# Patient Record
Sex: Male | Born: 1955 | Race: White | Hispanic: No | Marital: Married | Smoking: Never smoker
Health system: Southern US, Community
[De-identification: ages and names within clinical notes are randomized; demographics above are authoritative.]

## PROBLEM LIST (undated history)

## (undated) DIAGNOSIS — R55 Syncope and collapse: Secondary | ICD-10-CM

## (undated) DIAGNOSIS — T7840XA Allergy, unspecified, initial encounter: Secondary | ICD-10-CM

## (undated) DIAGNOSIS — K649 Unspecified hemorrhoids: Secondary | ICD-10-CM

## (undated) DIAGNOSIS — K219 Gastro-esophageal reflux disease without esophagitis: Secondary | ICD-10-CM

## (undated) DIAGNOSIS — J302 Other seasonal allergic rhinitis: Secondary | ICD-10-CM

## (undated) DIAGNOSIS — I10 Essential (primary) hypertension: Secondary | ICD-10-CM

## (undated) DIAGNOSIS — R519 Headache, unspecified: Secondary | ICD-10-CM

## (undated) DIAGNOSIS — K602 Anal fissure, unspecified: Secondary | ICD-10-CM

## (undated) DIAGNOSIS — R51 Headache: Secondary | ICD-10-CM

## (undated) DIAGNOSIS — K635 Polyp of colon: Secondary | ICD-10-CM

## (undated) DIAGNOSIS — G459 Transient cerebral ischemic attack, unspecified: Secondary | ICD-10-CM

## (undated) DIAGNOSIS — E785 Hyperlipidemia, unspecified: Secondary | ICD-10-CM

## (undated) HISTORY — DX: Headache: R51

## (undated) HISTORY — PX: COLONOSCOPY W/ POLYPECTOMY: SHX1380

## (undated) HISTORY — PX: RHINOPLASTY: SUR1284

## (undated) HISTORY — DX: Essential (primary) hypertension: I10

## (undated) HISTORY — DX: Unspecified hemorrhoids: K64.9

## (undated) HISTORY — DX: Anal fissure, unspecified: K60.2

## (undated) HISTORY — DX: Gastro-esophageal reflux disease without esophagitis: K21.9

## (undated) HISTORY — DX: Hyperlipidemia, unspecified: E78.5

## (undated) HISTORY — DX: Allergy, unspecified, initial encounter: T78.40XA

## (undated) HISTORY — DX: Headache, unspecified: R51.9

## (undated) HISTORY — DX: Syncope and collapse: R55

## (undated) HISTORY — DX: Other seasonal allergic rhinitis: J30.2

## (undated) HISTORY — DX: Polyp of colon: K63.5

## (undated) HISTORY — DX: Transient cerebral ischemic attack, unspecified: G45.9

---

## 1999-04-16 ENCOUNTER — Emergency Department (HOSPITAL_COMMUNITY): Admission: EM | Admit: 1999-04-16 | Discharge: 1999-04-16 | Payer: Self-pay | Admitting: Emergency Medicine

## 1999-04-21 ENCOUNTER — Encounter: Payer: Self-pay | Admitting: Family Medicine

## 1999-04-21 ENCOUNTER — Ambulatory Visit (HOSPITAL_COMMUNITY): Admission: RE | Admit: 1999-04-21 | Discharge: 1999-04-21 | Payer: Self-pay | Admitting: Family Medicine

## 2000-10-08 ENCOUNTER — Encounter (INDEPENDENT_AMBULATORY_CARE_PROVIDER_SITE_OTHER): Payer: Self-pay | Admitting: Specialist

## 2000-10-08 ENCOUNTER — Other Ambulatory Visit: Admission: RE | Admit: 2000-10-08 | Discharge: 2000-10-08 | Payer: Self-pay | Admitting: Internal Medicine

## 2003-06-04 ENCOUNTER — Encounter: Admission: RE | Admit: 2003-06-04 | Discharge: 2003-06-04 | Payer: Self-pay | Admitting: Family Medicine

## 2003-06-04 ENCOUNTER — Encounter: Payer: Self-pay | Admitting: Family Medicine

## 2005-06-13 ENCOUNTER — Ambulatory Visit: Payer: Self-pay | Admitting: Internal Medicine

## 2005-11-14 ENCOUNTER — Observation Stay (HOSPITAL_COMMUNITY): Admission: EM | Admit: 2005-11-14 | Discharge: 2005-11-15 | Payer: Self-pay | Admitting: Emergency Medicine

## 2008-03-04 ENCOUNTER — Encounter: Admission: RE | Admit: 2008-03-04 | Discharge: 2008-03-04 | Payer: Self-pay | Admitting: Family Medicine

## 2008-08-24 ENCOUNTER — Ambulatory Visit: Payer: Self-pay | Admitting: Internal Medicine

## 2008-09-07 ENCOUNTER — Ambulatory Visit: Payer: Self-pay | Admitting: Internal Medicine

## 2008-09-07 ENCOUNTER — Encounter: Payer: Self-pay | Admitting: Internal Medicine

## 2008-09-09 ENCOUNTER — Encounter: Payer: Self-pay | Admitting: Internal Medicine

## 2008-11-17 ENCOUNTER — Telehealth: Payer: Self-pay | Admitting: Internal Medicine

## 2008-11-23 ENCOUNTER — Ambulatory Visit: Payer: Self-pay | Admitting: Gastroenterology

## 2008-11-23 DIAGNOSIS — K648 Other hemorrhoids: Secondary | ICD-10-CM | POA: Insufficient documentation

## 2008-11-23 DIAGNOSIS — Z860100 Personal history of colon polyps, unspecified: Secondary | ICD-10-CM | POA: Insufficient documentation

## 2008-11-23 DIAGNOSIS — K625 Hemorrhage of anus and rectum: Secondary | ICD-10-CM | POA: Insufficient documentation

## 2008-11-30 ENCOUNTER — Telehealth (INDEPENDENT_AMBULATORY_CARE_PROVIDER_SITE_OTHER): Payer: Self-pay | Admitting: *Deleted

## 2008-12-09 ENCOUNTER — Encounter: Payer: Self-pay | Admitting: Internal Medicine
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
## 2011-09-19 ENCOUNTER — Other Ambulatory Visit: Payer: Self-pay | Admitting: Internal Medicine

## 2011-09-19 DIAGNOSIS — M542 Cervicalgia: Secondary | ICD-10-CM

## 2011-09-28 ENCOUNTER — Ambulatory Visit
Admission: RE | Admit: 2011-09-28 | Discharge: 2011-09-28 | Disposition: A | Discharge status: Home / Self Care | Payer: BC Managed Care – PPO | Source: Ambulatory Visit | Attending: Internal Medicine | Admitting: Internal Medicine

## 2011-09-28 DIAGNOSIS — M542 Cervicalgia: Secondary | ICD-10-CM

## 2011-12-19 ENCOUNTER — Encounter: Payer: Self-pay | Admitting: *Deleted

## 2012-03-06 ENCOUNTER — Encounter: Payer: Self-pay | Admitting: *Deleted

## 2012-03-07 ENCOUNTER — Encounter: Payer: Self-pay | Admitting: Cardiology

## 2012-08-15 ENCOUNTER — Ambulatory Visit (INDEPENDENT_AMBULATORY_CARE_PROVIDER_SITE_OTHER): Payer: BC Managed Care – PPO | Admitting: Family Medicine

## 2012-08-15 DIAGNOSIS — Z23 Encounter for immunization: Secondary | ICD-10-CM

## 2013-02-04 ENCOUNTER — Encounter: Payer: Self-pay | Admitting: Cardiology

## 2013-03-05 ENCOUNTER — Encounter: Payer: Self-pay | Admitting: Cardiovascular Disease

## 2013-07-17 ENCOUNTER — Encounter: Payer: Self-pay | Admitting: Internal Medicine

## 2013-07-31 ENCOUNTER — Encounter: Payer: Self-pay | Admitting: Internal Medicine

## 2013-08-19 ENCOUNTER — Encounter: Payer: BC Managed Care – PPO | Admitting: Cardiovascular Disease

## 2013-09-04 ENCOUNTER — Ambulatory Visit (INDEPENDENT_AMBULATORY_CARE_PROVIDER_SITE_OTHER): Payer: BC Managed Care – PPO | Admitting: Family Medicine

## 2013-09-04 DIAGNOSIS — Z23 Encounter for immunization: Secondary | ICD-10-CM

## 2013-09-18 ENCOUNTER — Ambulatory Visit (AMBULATORY_SURGERY_CENTER): Payer: Self-pay

## 2013-09-18 VITALS — Ht 74.0 in | Wt 185.0 lb

## 2013-09-18 DIAGNOSIS — Z860100 Personal history of colon polyps, unspecified: Secondary | ICD-10-CM

## 2013-09-18 MED ORDER — MOVIPREP 100 G PO SOLR: 1.0000 | Freq: Once | ORAL | Status: DC

## 2013-09-19 ENCOUNTER — Encounter: Payer: Self-pay | Admitting: Internal Medicine

## 2013-09-23 ENCOUNTER — Encounter: Payer: Self-pay | Admitting: Cardiovascular Disease

## 2013-09-23 ENCOUNTER — Ambulatory Visit (INDEPENDENT_AMBULATORY_CARE_PROVIDER_SITE_OTHER): Payer: BC Managed Care – PPO | Admitting: Cardiovascular Disease

## 2013-09-23 VITALS — BP 130/88 | HR 67 | Ht 74.0 in | Wt 196.4 lb

## 2013-09-23 DIAGNOSIS — I951 Orthostatic hypotension: Secondary | ICD-10-CM | POA: Insufficient documentation

## 2013-09-23 DIAGNOSIS — R55 Syncope and collapse: Secondary | ICD-10-CM

## 2013-09-23 MED ORDER — ASPIRIN 81 MG PO TABS: 81.0000 mg | ORAL_TABLET | Freq: Every day | ORAL | Status: DC

## 2013-09-23 NOTE — Progress Notes (Signed)
     Daniel Wu Date of Birth  05/16/1956       [facility] Office [address]  [address], [address] [place], [place]  [address]   [place], [place]  [address] [phone]     [phone]   Fax  [phone]    Fax [phone]  Problem List: 1. Presyncope 2. Chest pain 3. hyperlipidemia  History of Present Illness:  Daniel Wu who has had presyncope in the past.  He still has some mild episodes - lasts 3-5 seconds at most.  Occurs when he stands up from a seated position or if he has been standing for a while. There is no vertigo symptoms.  He goes to the [facility] on a regular basis.  He has no issues when he's working out.  He works in Airline pilot in [employer].   Works part time now.    Current Outpatient Prescriptions on File Prior to Visit  Medication Sig Dispense Refill  . aspirin 325 MG tablet Take 325 mg by mouth daily.       No current facility-administered medications on file prior to visit.    Allergies  Allergen Reactions  . Penicillins Cross Reactors Swelling    All "cillins"    Past Medical History  Diagnosis Date  . Syncope   . HLD (hyperlipidemia)   . GERD (gastroesophageal reflux disease)     Past Surgical History  Procedure Laterality Date  . Colonoscopy w/ polypectomy    . Rhinoplasty      History  Smoking status  . Never Smoker   Smokeless tobacco  . Never Used    History  Alcohol Use  . 1.0 oz/week  . 2 drink(s) per week    Family History  Problem Relation Age of Onset  . Breast cancer    . Lung cancer    . Hypertension    . Diabetes    . Stomach cancer Father   . Colon cancer Neg Hx   . Pancreatic cancer Neg Hx     Reviw of Systems:  Reviewed in the HPI.  All other systems are negative.  Physical Exam: Blood pressure 130/88, pulse 67, height 6\' 2"  (1.88 m), weight 196 lb 6.4 oz (89.086 kg). General: Well developed, well nourished, in no acute  distress.  Head: Normocephalic, atraumatic, sclera non-icteric, mucus membranes are moist,   Neck: Supple. Carotids are 2 + without bruits. No JVD   Lungs: Clear   Heart: RR, normal S1 S2  Abdomen: Soft, non-tender, non-distended with normal bowel sounds.  Msk:  Strength and tone are normal   Extremities: No clubbing or cyanosis. No edema.  Distal pedal pulses are 2+ and equal    Neuro: CN II - XII intact.  Alert and oriented X 3.   Psych:  Normal  ECG: Dec. 23, 2014:  NR at 52.  Normal ECG  Assessment / Plan:

## 2013-09-23 NOTE — Assessment & Plan Note (Signed)
Daniel Wu is doing well.  He has continued to have mild episodes of orthostatic hypotension.  We have discussed keeping hydrated and eating regularly.    He should continue exercising.    I will see him on an as needed basis.

## 2013-09-23 NOTE — Patient Instructions (Signed)
Your physician has recommended you make the following change in your medication: REDUCE ASPIRIN TO 81 MG DAILY  Your physician recommends that you schedule a follow-up appointment in: AS NEEDED

## 2013-10-09 ENCOUNTER — Encounter: Payer: Self-pay | Admitting: Internal Medicine

## 2013-10-09 ENCOUNTER — Ambulatory Visit (AMBULATORY_SURGERY_CENTER): Payer: BC Managed Care – PPO | Admitting: Internal Medicine

## 2013-10-09 VITALS — BP 126/79 | HR 69 | Temp 96.3°F | Resp 20 | Ht 74.0 in | Wt 185.0 lb

## 2013-10-09 DIAGNOSIS — Z86010 Personal history of colonic polyps: Secondary | ICD-10-CM

## 2013-10-09 MED ORDER — SODIUM CHLORIDE 0.9 % IV SOLN: 500.0000 mL | INTRAVENOUS | Status: DC

## 2013-10-09 NOTE — Op Note (Signed)
[place]  [address] [place], [address]   COLONOSCOPY PROCEDURE REPORT  PATIENT: [name], Daniel  MR#: [id number] BIRTHDATE: [birth year] , 57  yrs. old GENDER: Male ENDOSCOPIST: Eustace Quail, MD REFERRED WU:[id number] Program Recall PROCEDURE DATE:  10/09/2013 PROCEDURE:   Colonoscopy, surveillance First Screening Colonoscopy - Avg.  risk and is 50 yrs.  old or older - No.  Prior Negative Screening - Now for repeat screening. N/A  History of Adenoma - Now for follow-up colonoscopy & has been > or = to 3 yrs.  Yes hx of adenoma.  Has been 3 or more years since last colonoscopy.  Polyps Removed Today? No.  Recommend repeat exam, <10 yrs? Yes.  High risk (family or personal hx). ASA CLASS:   Class II INDICATIONS:Patient's personal history of MULTIPLE adenomatous colon polyps.   Prior exams 1996,97,2002,05,2010 MEDICATIONS: MAC sedation, administered by CRNA and propofol (Diprivan) 300mg  IV  DESCRIPTION OF PROCEDURE:   After the risks benefits and alternatives of the procedure were thoroughly explained, informed consent was obtained.  A digital rectal exam revealed no abnormalities of the rectum.   The LB [id number]  endoscope was introduced through the anus and advanced to the cecum, which was identified by both the appendix and ileocecal valve. No adverse events experienced.   The quality of the prep was excellent, using MoviPrep  The instrument was then slowly withdrawn as the colon was fully examined.   COLON FINDINGS: A normal appearing cecum, ileocecal valve, and appendiceal orifice were identified.  The ascending, hepatic flexure, transverse, splenic flexure, descending, sigmoid colon and rectum appeared unremarkable.  No polyps or cancers were seen. Retroflexed views revealed internal hemorrhoids. The time to cecum=2 minutes 50 seconds.  Withdrawal time=10 minutes 04 seconds. The scope was withdrawn and the procedure  completed. COMPLICATIONS: There were no complications.  ENDOSCOPIC IMPRESSION: 1. Normal colon  RECOMMENDATIONS: 1. Follow up colonoscopy in 5 years (personal hx multiple polyps)   eSigned:  Eustace Quail, MD 10/09/2013 9:31 AM   cc: Leanna Battles, MD and The Patient

## 2013-10-09 NOTE — Progress Notes (Signed)
Lidocaine-40mg IV prior to Propofol InductionPropofol given over incremental dosages 

## 2013-10-09 NOTE — Patient Instructions (Addendum)
YOU HAD AN ENDOSCOPIC PROCEDURE TODAY AT THE [facility]: Refer to the procedure report that was given to you for any specific questions about what was found during the examination.  If the procedure report does not answer your questions, please call your gastroenterologist to clarify.  If you requested that your care partner not be given the details of your procedure findings, then the procedure report has been included in a sealed envelope for you to review at your convenience later.  YOU SHOULD EXPECT: Some feelings of bloating in the abdomen. Passage of more gas than usual.  Walking can help get rid of the air that was put into your GI tract during the procedure and reduce the bloating. If you had a lower endoscopy (such as a colonoscopy or flexible sigmoidoscopy) you may notice spotting of blood in your stool or on the toilet paper. If you underwent a bowel prep for your procedure, then you may not have a normal bowel movement for a few days.  DIET: Your first meal following the procedure should be a light meal and then it is ok to progress to your normal diet.  A half-sandwich or bowl of soup is an example of a good first meal.  Heavy or fried foods are harder to digest and may make you feel nauseous or bloated.  Likewise meals heavy in dairy and vegetables can cause extra gas to form and this can also increase the bloating.  Drink plenty of fluids but you should avoid alcoholic beverages for 24 hours.  ACTIVITY: Your care partner should take you home directly after the procedure.  You should plan to take it easy, moving slowly for the rest of the day.  You can resume normal activity the day after the procedure however you should NOT DRIVE or use heavy machinery for 24 hours (because of the sedation medicines used during the test).    SYMPTOMS TO REPORT IMMEDIATELY: A gastroenterologist can be reached at any hour.  During normal business hours, 8:30 AM to 5:00 PM Monday through Friday,  call [phone].  After hours and on weekends, please call the GI answering service at [phone] who will take a message and have the physician on call contact you.   Following lower endoscopy (colonoscopy or flexible sigmoidoscopy):  Excessive amounts of blood in the stool  Significant tenderness or worsening of abdominal pains  Swelling of the abdomen that is new, acute  Fever of 100F or higher    FOLLOW UP: If any biopsies were taken you will be contacted by phone or by letter within the next 1-3 weeks.  Call your gastroenterologist if you have not heard about the biopsies in 3 weeks.  Our staff will call the home number listed on your records the next business day following your procedure to check on you and address any questions or concerns that you may have at that time regarding the information given to you following your procedure. This is a courtesy call and so if there is no answer at the home number and we have not heard from you through the emergency physician on call, we will assume that you have returned to your regular daily activities without incident.  SIGNATURES/CONFIDENTIALITY: You and/or your care partner have signed paperwork which will be entered into your electronic medical record.  These signatures attest to the fact that that the information above on your After Visit Summary has been reviewed and is understood.  Full responsibility of the confidentiality   of this discharge information lies with you and/or your care-partner.  Normal colonoscopy.    Repeat in 5 years-2020 (hx multiple polyps)

## 2013-10-10 ENCOUNTER — Telehealth: Payer: Self-pay

## 2013-10-10 NOTE — Telephone Encounter (Signed)
Left message on answering machine. 

## 2014-10-02 DIAGNOSIS — G459 Transient cerebral ischemic attack, unspecified: Secondary | ICD-10-CM

## 2014-10-02 HISTORY — DX: Transient cerebral ischemic attack, unspecified: G45.9

## 2014-10-12 ENCOUNTER — Ambulatory Visit (INDEPENDENT_AMBULATORY_CARE_PROVIDER_SITE_OTHER): Payer: 59 | Admitting: Physician Assistant

## 2014-10-12 ENCOUNTER — Encounter: Payer: Self-pay | Admitting: Physician Assistant

## 2014-10-12 VITALS — BP 134/88 | HR 64 | Ht 74.0 in | Wt 197.0 lb

## 2014-10-12 DIAGNOSIS — I517 Cardiomegaly: Secondary | ICD-10-CM

## 2014-10-12 DIAGNOSIS — R079 Chest pain, unspecified: Secondary | ICD-10-CM

## 2014-10-12 DIAGNOSIS — I341 Nonrheumatic mitral (valve) prolapse: Secondary | ICD-10-CM

## 2014-10-12 DIAGNOSIS — Z0000 Encounter for general adult medical examination without abnormal findings: Secondary | ICD-10-CM

## 2014-10-12 DIAGNOSIS — R42 Dizziness and giddiness: Secondary | ICD-10-CM

## 2014-10-12 NOTE — Assessment & Plan Note (Addendum)
Patient had an episode of chest pain that lasted a couple days and was constant. He had no associated symptoms other than bloating sensation. He has no cardiac risk factors for CAD. EKG is normal. He had normal stress Myoview in 2011. He did have septal hypertrophy and mitral valve prolapse on 2-D echo in 2011. We will repeat that. He will follow-up with Dr.Nahser in one month. He had full labs last May by Dr. Sharlett Iles, I won't repeat.

## 2014-10-12 NOTE — Assessment & Plan Note (Signed)
Patient has had chronic problems with dizziness sometimes orthostatic and other times vertigo. Most recently sounds like vertigo relieved with meclizine.

## 2014-10-12 NOTE — Progress Notes (Signed)
HPI: This is a 59 year old male patient of Dr.Nahser that has history of presyncope felt secondary to orthostatic hypotension. He also has a history of chest pain and hyperlipidemia. He was last seen here in December 2014. Patient had a normal stress Myoview in 2011. 2-D echo in 2011 showed mild LVH with impaired LV relaxation and normal LV systolic function, mitral valve prolapse with posterior valve prolapse, mild MR, mild AI and prominent upper septal hypertrophy.  Patient comes in today because 2 weeks ago he developed a bloated feeling followed by a sensation of something sitting on his chest. It lasted a couple days and was constant. Exertion didn't make it any worse. He described it as a 2 on a scale of 1-10. He had no radiation of the pain associated dyspnea, dyspnea on exertion, palpitations, or presyncope. This was followed by a cramp in his right chest that lasted 3 days. He says this was worse when he laid down at night. The patient walks 3-4 miles a day on a treadmill, elliptical, or bike at the [facility]. He has no symptoms while exercising. He does have vertigo for which he takes meclizine. He feels like his balance has been off little bit at times and he did have dizziness yesterday while standing relieved with meclizine.  Patient has no history of hypertension, diabetes mellitus, hyperlipidemia, he's never smoked and has no family history of CAD. His mother and father died at 74 and 11 of other problems.   Allergies  Allergen Reactions  . Penicillins Cross Reactors Swelling    All "cillins"     Current Outpatient Prescriptions  Medication Sig Dispense Refill  . aspirin 81 MG tablet Take 1 tablet (81 mg total) by mouth daily.     No current facility-administered medications for this visit.    Past Medical History  Diagnosis Date  . Syncope   . HLD (hyperlipidemia)   . GERD (gastroesophageal reflux disease)     Past Surgical History  Procedure Laterality Date    . Colonoscopy w/ polypectomy    . Rhinoplasty      Family History  Problem Relation Age of Onset  . Breast cancer    . Lung cancer    . Hypertension    . Diabetes    . Stomach cancer Father   . Colon cancer Neg Hx   . Pancreatic cancer Neg Hx     History   Social History  . Marital Status: Married    Spouse Name: N/A    Number of Children: 2  . Years of Education: N/A   Occupational History  . sales executive    Social History Main Topics  . Smoking status: Never Smoker   . Smokeless tobacco: Never Used  . Alcohol Use: 1.0 oz/week    2 drink(s) per week  . Drug Use: Not on file  . Sexual Activity: Not on file   Other Topics Concern  . Not on file   Social History Narrative  . No narrative on file    ROS: See history of present illness otherwise negative  BP 134/88 mmHg  Pulse 64  Ht 6\' 2"  (1.88 m)  Wt 197 lb (89.359 kg)  BMI 25.28 kg/m2  PHYSICAL EXAM: Well-nournished, in no acute distress. Neck: No JVD, HJR, Bruit, or thyroid enlargement  Lungs: No tachypnea, clear without wheezing, rales, or rhonchi  Cardiovascular: RRR, PMI not displaced, Normal S1 and S2, no murmurs, gallops, bruit, thrill, or heave.  Abdomen: BS normal. Soft without organomegaly, masses, lesions or tenderness.  Extremities: without cyanosis, clubbing or edema. Good distal pulses bilateral  SKin: Warm, no lesions or rashes   Musculoskeletal: No deformities  Neuro: no focal signs   Wt Readings from Last 3 Encounters:  10/09/13 185 lb (83.915 kg)  09/23/13 196 lb 6.4 oz (89.086 kg)  09/18/13 185 lb (83.915 kg)    No results found for: WBC, HGB, HCT, PLT, GLUCOSE, CHOL, TRIG, HDL, LDLDIRECT, LDLCALC, ALT, AST, NA, K, CL, CREATININE, BUN, CO2, TSH, PSA, INR, GLUF, HGBA1C, MICROALBUR  EKG: Normal sinus rhythm normal EKG

## 2014-10-12 NOTE — Patient Instructions (Signed)
Your physician has requested that you have an echocardiogram. Echocardiography is a painless test that uses sound waves to create images of your heart. It provides your doctor with information about the size and shape of your heart and how well your heart's chambers and valves are working. This procedure takes approximately one hour. There are no restrictions for this procedure.  You have been referred to Nutritionist   Your physician recommends that you schedule a follow-up appointment within the month with Dr. Cathie Olden.

## 2014-10-20 ENCOUNTER — Ambulatory Visit (HOSPITAL_COMMUNITY): Payer: 59 | Attending: Cardiology | Admitting: Cardiology

## 2014-10-20 DIAGNOSIS — I517 Cardiomegaly: Secondary | ICD-10-CM | POA: Diagnosis not present

## 2014-10-20 DIAGNOSIS — Z0000 Encounter for general adult medical examination without abnormal findings: Secondary | ICD-10-CM | POA: Insufficient documentation

## 2014-10-20 DIAGNOSIS — I341 Nonrheumatic mitral (valve) prolapse: Secondary | ICD-10-CM | POA: Diagnosis not present

## 2014-10-20 DIAGNOSIS — R079 Chest pain, unspecified: Secondary | ICD-10-CM | POA: Insufficient documentation

## 2014-10-20 NOTE — Progress Notes (Signed)
Echo performed. 

## 2014-11-06 ENCOUNTER — Encounter: Payer: Self-pay | Admitting: Cardiovascular Disease

## 2014-11-06 ENCOUNTER — Ambulatory Visit (INDEPENDENT_AMBULATORY_CARE_PROVIDER_SITE_OTHER): Payer: 59 | Admitting: Cardiovascular Disease

## 2014-11-06 VITALS — BP 128/84 | HR 68 | Ht 74.0 in | Wt 191.8 lb

## 2014-11-06 DIAGNOSIS — R079 Chest pain, unspecified: Secondary | ICD-10-CM

## 2014-11-06 DIAGNOSIS — I951 Orthostatic hypotension: Secondary | ICD-10-CM

## 2014-11-06 DIAGNOSIS — I341 Nonrheumatic mitral (valve) prolapse: Secondary | ICD-10-CM

## 2014-11-06 NOTE — Progress Notes (Signed)
Cardiology Office Note   Date:  11/06/2014   ID:  Daniel Wu, DOB 02-03-1956, MRN [id number]  PCP:  Donnajean Lopes, MD  Cardiologist:   Thayer Headings, MD   Chief Complaint  Patient presents with  . Follow-up    chest pain     Problem List  1. Presyncope 2. Chest pain 3. Hyperlipidemia 4. Mitral Valve prolapse   History of Present Illness: Daniel Wu is a 59 y.o. male who presents for an episode of chest pain . He was seen by Estella Husk several weeks ago for CP.   Daniel Wu has been having some atypical CP / pressures.  Worse when he slept on his left side.  Better if he would lie down on his back . He exercises on a rectal basis. He's not had any episodes of exertional chest pain or discomfort.    Past Medical History  Diagnosis Date  . Syncope   . HLD (hyperlipidemia)   . GERD (gastroesophageal reflux disease)     Past Surgical History  Procedure Laterality Date  . Colonoscopy w/ polypectomy    . Rhinoplasty       Current Outpatient Prescriptions  Medication Sig Dispense Refill  . aspirin 81 MG tablet Take 1 tablet (81 mg total) by mouth daily.     No current facility-administered medications for this visit.    Allergies:   Penicillins cross reactors    Social History:  The patient  reports that he has never smoked. He has never used smokeless tobacco. He reports that he drinks about 1.0 oz of alcohol per week.   Family History:  The patient's family history includes Breast cancer in an other family member; Diabetes in an other family member; Hypertension in an other family member; Lung cancer in an other family member; Stomach cancer in his father. There is no history of Colon cancer or Pancreatic cancer.    ROS:  Please see the history of present illness.    Review of Systems: Constitutional:  denies fever, chills, diaphoresis, appetite change and fatigue.  HEENT: denies photophobia, eye pain, redness, hearing loss, ear pain, congestion,  sore throat, rhinorrhea, sneezing, neck pain, neck stiffness and tinnitus.  Respiratory: denies SOB, DOE, cough, chest tightness, and wheezing.  Cardiovascular: denies chest pain, palpitations and leg swelling.  Gastrointestinal: denies nausea, vomiting, abdominal pain, diarrhea, constipation, blood in stool.  Genitourinary: denies dysuria, urgency, frequency, hematuria, flank pain and difficulty urinating.  Musculoskeletal: denies  myalgias, back pain, joint swelling, arthralgias and gait problem.   Skin: denies pallor, rash and wound.  Neurological: denies dizziness, seizures, syncope, weakness, light-headedness, numbness and headaches.   Hematological: denies adenopathy, easy bruising, personal or family bleeding history.  Psychiatric/ Behavioral: denies suicidal ideation, mood changes, confusion, nervousness, sleep disturbance and agitation.       All other systems are reviewed and negative.    PHYSICAL EXAM: VS:  BP 128/84 mmHg  Pulse 68  Ht 6\' 2"  (1.88 m)  Wt 191 lb 12.8 oz (87 kg)  BMI 24.62 kg/m2 , BMI Body mass index is 24.62 kg/(m^2). GEN: Well nourished, well developed, in no acute distress HEENT: normal Neck: no JVD, carotid bruits, or masses Cardiac: RRR; very soft  murmur, rubs, or gallops,no edema  Respiratory:  clear to auscultation bilaterally, normal work of breathing GI: soft, nontender, nondistended, + BS MS: no deformity or atrophy Skin: warm and dry, no rash Neuro:  Strength and sensation are intact Psych: normal   EKG:  EKG  is not ordered today.    Recent Labs: No results found for requested labs within last 365 days.    Lipid Panel No results found for: CHOL, TRIG, HDL, CHOLHDL, VLDL, LDLCALC, LDLDIRECT    Wt Readings from Last 3 Encounters:  11/06/14 191 lb 12.8 oz (87 kg)  10/12/14 197 lb (89.359 kg)  10/09/13 185 lb (83.915 kg)      Other studies Reviewed: Additional studies/ records that were reviewed today include: . Review of the  above records demonstrates:    ASSESSMENT AND PLAN:  1. Orthostatic hypotension - encouraged him to stay hydrated.  2. Chest pain- Daniel Wu had some episodes of chest pain that seemed to be more musculoskeletal. He will have chest pain when he is lying on his left side and it would be relieved when he turned over in bed. He's active and works out at [employer] on a regular basis. His echocardiogram shows normal left ventricle systolic motion. He has mild mitral valve prolapse. I do not think that he needs any additional follow-up for this chest pain. 3. Hyperlipidemia - followed by his general medical doctor  4. Mitral Valve prolapse - associated with mild mitral regurgitation. I do not think that he'll ever have to have this repaired.   Current medicines are reviewed at length with the patient today.  The patient does not have concerns regarding medicines.  The following changes have been made:  no change   Disposition:   FU with me as needed .  He will follow up with Dr. Trustin Chapa Aspen     Signed, Daniel Wu, Wonda Cheng, MD  11/06/2014 9:30 AM    [address], [place], [place]  [address] Phone: [phone]; Fax: [phone]

## 2014-11-06 NOTE — Patient Instructions (Signed)
Your physician recommends that you continue on your current medications as directed. Please refer to the Current Medication list given to you today.  Your physician recommends that you schedule a follow-up appointment in: as needed with Dr. Nahser  

## 2015-03-09 ENCOUNTER — Ambulatory Visit: Payer: 59 | Admitting: Neurology

## 2015-03-11 ENCOUNTER — Ambulatory Visit (INDEPENDENT_AMBULATORY_CARE_PROVIDER_SITE_OTHER): Payer: 59 | Admitting: Neurology

## 2015-03-11 ENCOUNTER — Encounter: Payer: Self-pay | Admitting: Neurology

## 2015-03-11 VITALS — BP 146/90 | HR 69 | Ht 74.0 in | Wt 193.0 lb

## 2015-03-11 DIAGNOSIS — R410 Disorientation, unspecified: Secondary | ICD-10-CM | POA: Diagnosis not present

## 2015-03-11 DIAGNOSIS — G458 Other transient cerebral ischemic attacks and related syndromes: Secondary | ICD-10-CM
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
## 2015-03-15 ENCOUNTER — Ambulatory Visit (INDEPENDENT_AMBULATORY_CARE_PROVIDER_SITE_OTHER): Payer: 59 | Admitting: Neurology

## 2015-03-15 DIAGNOSIS — G458 Other transient cerebral ischemic attacks and related syndromes: Secondary | ICD-10-CM

## 2015-03-15 DIAGNOSIS — R410 Disorientation, unspecified: Secondary | ICD-10-CM | POA: Diagnosis not present

## 2015-03-17 ENCOUNTER — Telehealth: Payer: Self-pay

## 2015-03-17 NOTE — Telephone Encounter (Signed)
Called Patient, scheduled US Carotid.  Patient verbalized understanding. 

## 2015-03-18 ENCOUNTER — Telehealth: Payer: Self-pay | Admitting: Neurology

## 2015-03-18 NOTE — Telephone Encounter (Signed)
Patient called requesting result from EEG study. Please call and advise. Patient can be reached [phone].

## 2015-03-19 ENCOUNTER — Other Ambulatory Visit: Payer: Self-pay

## 2015-03-19 ENCOUNTER — Ambulatory Visit (HOSPITAL_COMMUNITY): Payer: 59 | Attending: Cardiovascular Disease

## 2015-03-19 DIAGNOSIS — I340 Nonrheumatic mitral (valve) insufficiency: Secondary | ICD-10-CM | POA: Insufficient documentation

## 2015-03-19 DIAGNOSIS — G458 Other transient cerebral ischemic attacks and related syndromes: Secondary | ICD-10-CM | POA: Diagnosis not present

## 2015-03-19 DIAGNOSIS — R410 Disorientation, unspecified: Secondary | ICD-10-CM

## 2015-03-19 DIAGNOSIS — I351 Nonrheumatic aortic (valve) insufficiency: Secondary | ICD-10-CM | POA: Diagnosis not present

## 2015-03-19 DIAGNOSIS — I517 Cardiomegaly: Secondary | ICD-10-CM | POA: Diagnosis not present

## 2015-03-19 NOTE — Telephone Encounter (Signed)
I have called him for normal EEG.

## 2015-03-19 NOTE — Procedures (Signed)
   HISTORY: 59 year old male, with transient confusion  TECHNIQUE:  16 channel EEG was performed based on standard 10-16 international system. One channel was dedicated to EKG, which has demonstrates normal sinus rhythm of 66 beats per minutes.  Upon awakening, the posterior background activity was well-developed, in alpha range, 11 Hz with amplitude of 35 microvoltage, reactive to eye opening and closure.  There was no evidence of epileptiform discharge.  Photic stimulation was performed, which induced a symmetric photic driving.  Hyperventilation was performed, there was no abnormality elicit.  No sleep was achieved.  CONCLUSION: This is a  normal awake EEG.  There is no electrodiagnostic evidence of epileptiform discharge

## 2015-03-22 ENCOUNTER — Telehealth: Payer: Self-pay | Admitting: Neurology

## 2015-03-22 NOTE — Telephone Encounter (Signed)
Please call patient, echocardiogram showed mild enlarged atrium, otherwise no significant abnormality, I will forward the report to his primary care physician Dr. Sharlett Iles, no change in treatment plan.   - Left ventricle: The cavity size was mildly dilated. Systolic function was normal. The estimated ejection fraction was in the range of 55% to 60%. Wall motion was normal; there were no regional wall motion abnormalities. - Aortic valve: There was mild regurgitation. - Mitral valve: There was mild regurgitation. - Left atrium: The atrium was moderately dilated. - Atrial septum: No defect or patent foramen ovale was identified.

## 2015-03-22 NOTE — Telephone Encounter (Signed)
Patient aware of results.

## 2015-03-24 ENCOUNTER — Ambulatory Visit (INDEPENDENT_AMBULATORY_CARE_PROVIDER_SITE_OTHER): Payer: 59 | Admitting: Emergency Medicine

## 2015-03-24 VITALS — BP 140/80 | HR 88 | Temp 98.4°F | Resp 16 | Ht 74.0 in | Wt 194.0 lb

## 2015-03-24 DIAGNOSIS — T7840XA Allergy, unspecified, initial encounter: Secondary | ICD-10-CM | POA: Diagnosis not present

## 2015-03-24 MED ORDER — RANITIDINE HCL 150 MG PO TABS
150.0000 mg | ORAL_TABLET | Freq: Once | ORAL | Status: AC
  Administered 2015-03-24: 150 mg via ORAL

## 2015-03-24 MED ORDER — CETIRIZINE HCL 10 MG PO TABS
10.0000 mg | ORAL_TABLET | Freq: Once | ORAL | Status: AC
  Administered 2015-03-24: 10 mg via ORAL

## 2015-03-24 MED ORDER — PREDNISONE 20 MG PO TABS: ORAL_TABLET | ORAL | Status: DC

## 2015-03-24 NOTE — Progress Notes (Signed)
Subjective:    Patient ID: Daniel Wu, male    DOB: 1956-10-01, 59 y.o.   MRN: [id number]  HPI Patient presents today with rash on his buttocks and back. It started around midnight and awoke him from sleep due to itching. He has noticed that his bottom lip feels slightly swollen as well as his left eye. He started a new generic plavix formula two weeks ago. No new soaps/lotions/detergents/travel.  Took benadryl around 6 am which may have helped. Swelling in face has not gotten worse.  Has history of PCN allergy in childhood. He does not recall reaction. He has had one episode similar to this about a year ago and took otc antihistamine with full relief. He is going to [facility] for the day today, but will have someone with him all day.   His PCP is Dr. Valetta Fuller. He has an appointment with him next week. He has had a recent (03/04/15) episode of "confusion," with negative MRI, EEG and neuro workup. No further episodes.   Past Medical History  Diagnosis Date  . Syncope   . HLD (hyperlipidemia)   . GERD (gastroesophageal reflux disease)   . Headache    Past Surgical History  Procedure Laterality Date  . Colonoscopy w/ polypectomy    . Rhinoplasty     Family History  Problem Relation Age of Onset  . Breast cancer Mother   . Lung cancer Brother   . Hypertension Mother   . Diabetes Mother   . Stomach cancer Father   . Prostate cancer Father    History  Substance Use Topics  . Smoking status: Never Smoker   . Smokeless tobacco: Never Used  . Alcohol Use: 1.2 oz/week    2 Standard drinks or equivalent per week     Comment: Socially - beer 4-5 per month     Review of Systems No fever, no SOB, no chest pain, no swelling. No difficulty breathing or swallowing. No headache, no visual change.       Objective:   Physical Exam  Constitutional: He is oriented to person, place, and time. He appears well-developed and well-nourished. No distress.  HENT:  Head: Normocephalic and  atraumatic.  Right Ear: External ear normal.  Left Ear: External ear normal.  Nose: Nose normal.  Mouth/Throat: Oropharynx is clear and moist.  Eyes: Conjunctivae are normal.  Left upper eyelid mildly swollen.   Neck: Normal range of motion. Neck supple.  Cardiovascular: Normal rate, regular rhythm and normal heart sounds.   Pulmonary/Chest: Effort normal and breath sounds normal.  Musculoskeletal: Normal range of motion.  Lymphadenopathy:    He has no cervical adenopathy.  Neurological: He is alert and oriented to person, place, and time.  Skin: Skin is warm and dry. He is not diaphoretic.  Lower lip mildly swollen.   Psychiatric: He has a normal mood and affect. His behavior is normal. Judgment and thought content normal.  Vitals reviewed. BP 140/80 mmHg  Pulse 88  Temp(Src) 98.4 F (36.9 C) (Oral)  Resp 16  Ht 6\' 2"  (1.88 m)  Wt 194 lb (87.998 kg)  BMI 24.90 kg/m2  SpO2 96%     Assessment & Plan:  Discussed with Dr. Everlene Farrier 1. Allergic reaction, initial encounter - Provided written and verbal information regarding diagnosis and treatment. - instructed him to take cetirizine and ranitidine daily for 5-7 days. First dose given in office. - predniSONE (DELTASONE) 20 MG tablet; Take 3,3,2,2,1,1  Dispense: 12 tablet; Refill: 0 - Ambulatory  referral to Allergy - cetirizine (ZYRTEC) tablet 10 mg; Take 1 tablet (10 mg total) by mouth once. - ranitidine (ZANTAC) tablet 150 mg; Take 1 tablet (150 mg total) by mouth once. - RTC/ER if worsening symptoms- reviewed with patient: SOB, increased swelling, increased rash.  - follow up with PCP next week as scheduled  Clarene Reamer, FNP-BC  [facility]  03/24/2015 8:57 AM

## 2015-03-24 NOTE — Patient Instructions (Addendum)
Take prednisone every morning as ordered. Take with food and a full glass of water Take loratadine (generic zyrtec) daily for 5-7 days Take ranitidine (generic Zantac) for 5-7 days Anaphylactic Reaction An anaphylactic reaction is a sudden, severe allergic reaction that involves the whole body. It can be life threatening. A hospital stay is often required. People with asthma, eczema, or hay fever are slightly more likely to have an anaphylactic reaction. CAUSES  An anaphylactic reaction may be caused by anything to which you are allergic. After being exposed to the allergic substance, your immune system becomes sensitized to it. When you are exposed to that allergic substance again, an allergic reaction can occur. Common causes of an anaphylactic reaction include:  Medicines.  Foods, especially peanuts, wheat, shellfish, milk, and eggs.  Insect bites or stings.  Blood products.  Chemicals, such as dyes, latex, and contrast material used for imaging tests. SYMPTOMS  When an allergic reaction occurs, the body releases histamine and other substances. These substances cause symptoms such as tightening of the airway. Symptoms often develop within seconds or minutes of exposure. Symptoms may include:  Skin rash or hives.  Itching.  Chest tightness.  Swelling of the eyes, tongue, or lips.  Trouble breathing or swallowing.  Lightheadedness or fainting.  Anxiety or confusion.  Stomach pains, vomiting, or diarrhea.  Nasal congestion.  A fast or irregular heartbeat (palpitations). DIAGNOSIS  Diagnosis is based on your history of recent exposure to allergic substances, your symptoms, and a physical exam. Your caregiver may also perform blood or urine tests to confirm the diagnosis. TREATMENT  Epinephrine medicine is the main treatment for an anaphylactic reaction. Other medicines that may be used for treatment include antihistamines, steroids, and albuterol. In severe cases, fluids and  medicine to support blood pressure may be given through an intravenous line (IV). Even if you improve after treatment, you need to be observed to make sure your condition does not get worse. This may require a stay in the hospital. [place] a medical alert bracelet or necklace stating your allergy.  You and your family must learn how to use an anaphylaxis kit or give an epinephrine injection to temporarily treat an emergency allergic reaction. Always carry your epinephrine injection or anaphylaxis kit with you. This can be lifesaving if you have a severe reaction.  Do not drive or perform tasks after treatment until the medicines used to treat your reaction have worn off, or until your caregiver says it is okay.  If you have hives or a rash:  Take medicines as directed by your caregiver.  You may use an over-the-counter antihistamine (diphenhydramine) as needed.  Apply cold compresses to the skin or take baths in cool water. Avoid hot baths or showers. SEEK MEDICAL CARE IF:   You develop symptoms of an allergic reaction to a new substance. Symptoms may start right away or minutes later.  You develop a rash, hives, or itching.  You develop new symptoms. SEEK IMMEDIATE MEDICAL CARE IF:   You have swelling of the mouth, difficulty breathing, or wheezing.  You have a tight feeling in your chest or throat.  You develop hives, swelling, or itching all over your body.  You develop severe vomiting or diarrhea.  You feel faint or pass out. This is an emergency. Use your epinephrine injection or anaphylaxis kit as you have been instructed. Call your local emergency services (911 in [place] Even if you improve after the injection, you need to  be examined at a hospital emergency department. MAKE SURE YOU:   Understand these instructions.  Will watch your condition.  Will get help right away if you are not doing well or get worse. Document Released: 09/18/2005  Document Revised: 09/23/2013 Document Reviewed: 12/20/2011 [facility] Patient Information 2015 [place], [place]. This information is not intended to replace advice given to you by your health care provider. Make sure you discuss any questions you have with your health care provider.

## 2015-04-07 ENCOUNTER — Ambulatory Visit (INDEPENDENT_AMBULATORY_CARE_PROVIDER_SITE_OTHER): Payer: 59

## 2015-04-07 DIAGNOSIS — G458 Other transient cerebral ischemic attacks and related syndromes: Secondary | ICD-10-CM | POA: Diagnosis not present

## 2015-04-07 DIAGNOSIS — R410 Disorientation, unspecified: Secondary | ICD-10-CM

## 2015-04-12 ENCOUNTER — Encounter: Payer: Self-pay | Admitting: Neurology

## 2015-04-12 ENCOUNTER — Ambulatory Visit (INDEPENDENT_AMBULATORY_CARE_PROVIDER_SITE_OTHER): Payer: 59 | Admitting: Neurology

## 2015-04-12 VITALS — BP 139/81 | HR 75 | Ht 74.0 in | Wt 190.0 lb

## 2015-04-12 DIAGNOSIS — R410 Disorientation, unspecified: Secondary | ICD-10-CM | POA: Diagnosis not present

## 2015-04-12 DIAGNOSIS — G458 Other transient cerebral ischemic attacks and related syndromes: Secondary | ICD-10-CM
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
## 2015-04-14 ENCOUNTER — Telehealth: Payer: Self-pay | Admitting: *Deleted

## 2015-04-14 NOTE — Telephone Encounter (Signed)
Called patient - per Dr. Krista Blue, his carotid duplex study show no significant bilateral IC stenosis.

## 2015-04-30 ENCOUNTER — Encounter: Payer: Self-pay | Admitting: Gastroenterology

## 2015-04-30 ENCOUNTER — Encounter: Payer: Self-pay | Admitting: Internal Medicine

## 2016-07-20 ENCOUNTER — Other Ambulatory Visit: Payer: Self-pay | Admitting: Internal Medicine

## 2016-07-20 DIAGNOSIS — E041 Nontoxic single thyroid nodule: Secondary | ICD-10-CM

## 2016-07-24 ENCOUNTER — Other Ambulatory Visit: Payer: Self-pay | Admitting: Internal Medicine

## 2016-07-24 ENCOUNTER — Ambulatory Visit
Admission: RE | Admit: 2016-07-24 | Discharge: 2016-07-24 | Disposition: A | Discharge status: Home / Self Care | Payer: Self-pay | Source: Ambulatory Visit | Attending: Internal Medicine | Admitting: Internal Medicine

## 2016-07-24 DIAGNOSIS — E041 Nontoxic single thyroid nodule: Secondary | ICD-10-CM

## 2016-08-03 ENCOUNTER — Other Ambulatory Visit (HOSPITAL_COMMUNITY)
Admission: RE | Admit: 2016-08-03 | Discharge: 2016-08-03 | Disposition: A | Discharge status: Home / Self Care | Payer: 59 | Source: Ambulatory Visit | Attending: Radiology | Admitting: Radiology

## 2016-08-03 ENCOUNTER — Ambulatory Visit
Admission: RE | Admit: 2016-08-03 | Discharge: 2016-08-03 | Disposition: A | Discharge status: Home / Self Care | Payer: 59 | Source: Ambulatory Visit | Attending: Internal Medicine | Admitting: Internal Medicine

## 2016-08-03 DIAGNOSIS — E041 Nontoxic single thyroid nodule: Secondary | ICD-10-CM

## 2016-11-28 DIAGNOSIS — R238 Other skin changes: Secondary | ICD-10-CM | POA: Diagnosis not present

## 2016-11-28 DIAGNOSIS — D225 Melanocytic nevi of trunk: Secondary | ICD-10-CM | POA: Diagnosis not present

## 2016-11-28 DIAGNOSIS — L720 Epidermal cyst: Secondary | ICD-10-CM | POA: Diagnosis not present

## 2017-02-16 IMAGING — US US THYROID BIOPSY
1 series · 13 of 20 positions shown · non-contrast
Comparison: Thyroid ultrasound performed 07/18/2016

MEDICATIONS:
None

COMPLICATIONS:
None immediate.

INDICATION: Indeterminate left thyroid nodule

EXAM:
ULTRASOUND GUIDED FINE NEEDLE ASPIRATION OF INDETERMINATE LEFT
THYROID NODULE
TECHNIQUE: Informed written consent was obtained from the patient after a
discussion of the risks, benefits and alternatives to treatment.
Questions regarding the procedure were encouraged and answered. A
timeout was performed prior to the initiation of the procedure.

[Series 1: us thyroid biopsy · 0.08mm/px · 20 acquisitions, 13 frames shown]
[im 1/20]
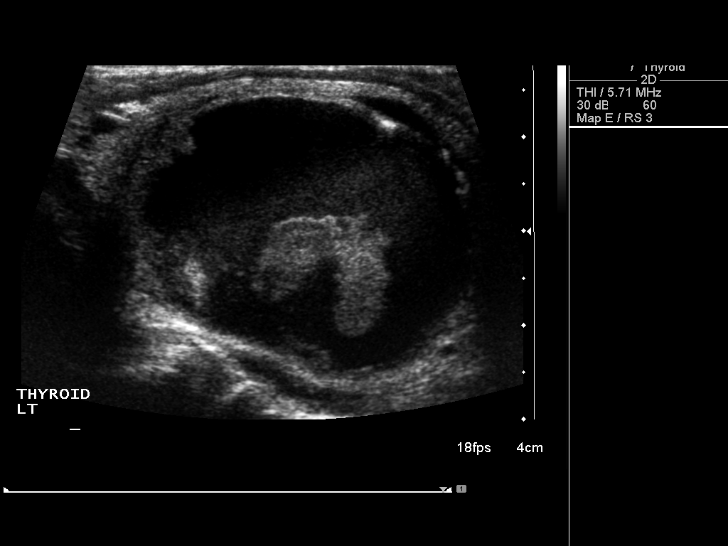
[im 3/20]
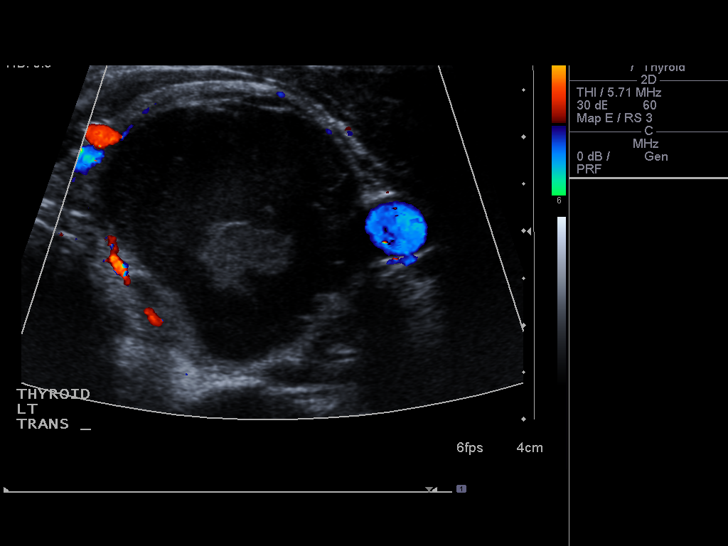
[im 4/20]
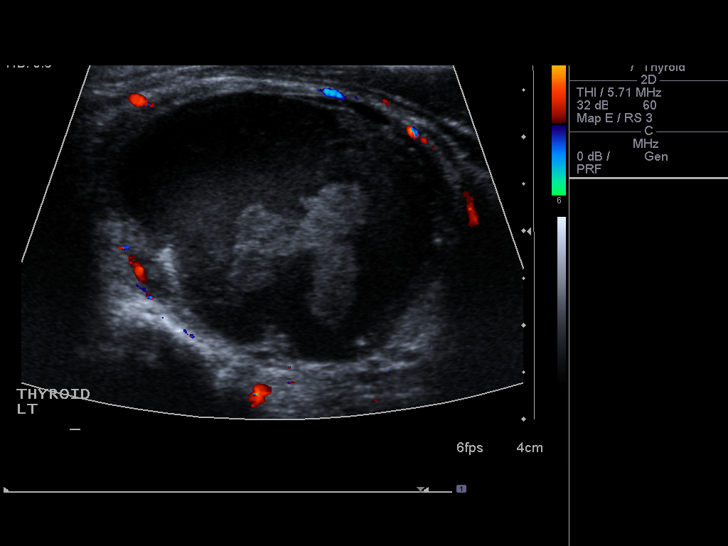
[im 6/20]
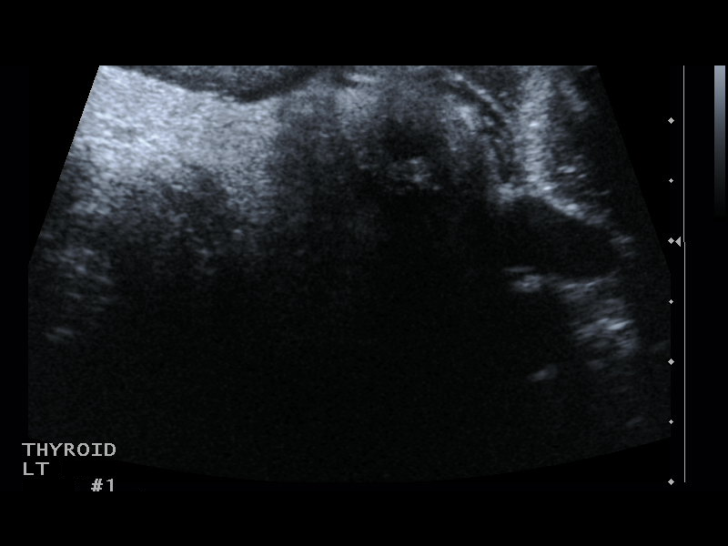
[im 7/20]
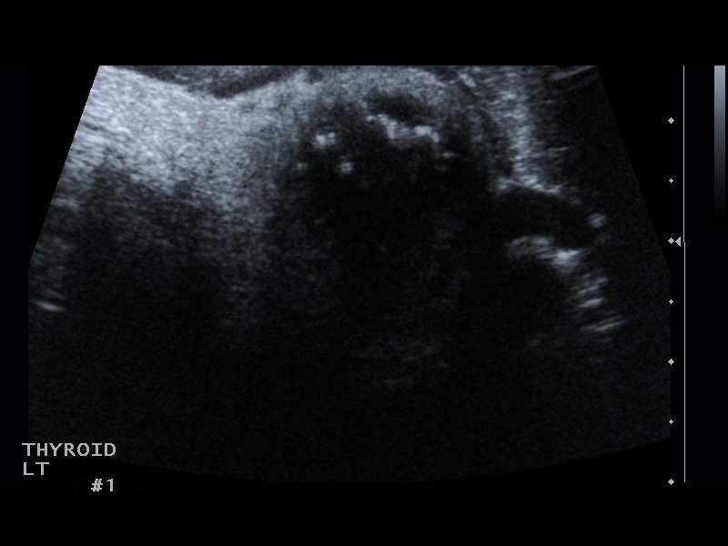
[im 9/20]
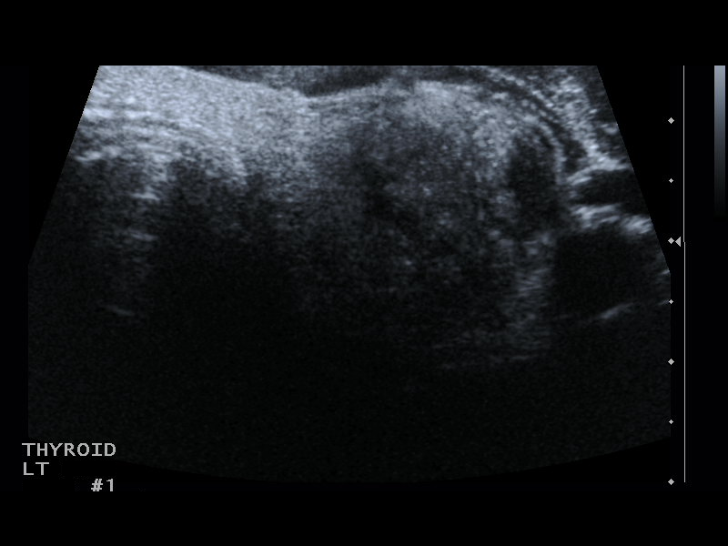
[im 11/20]
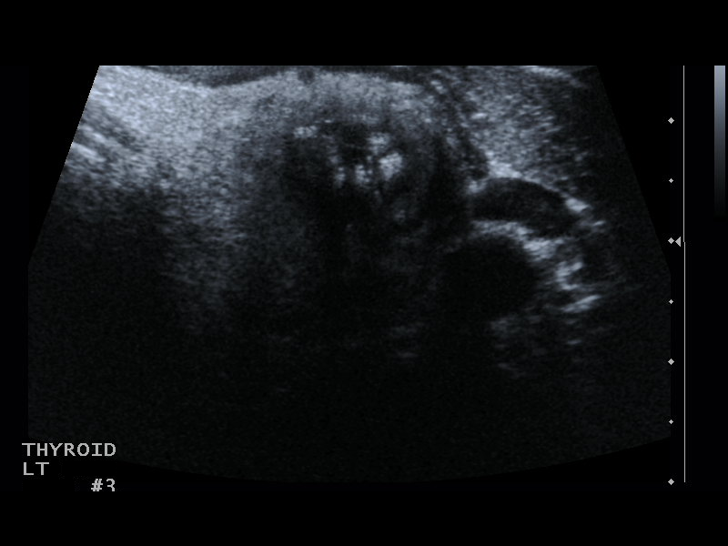
[im 12/20]
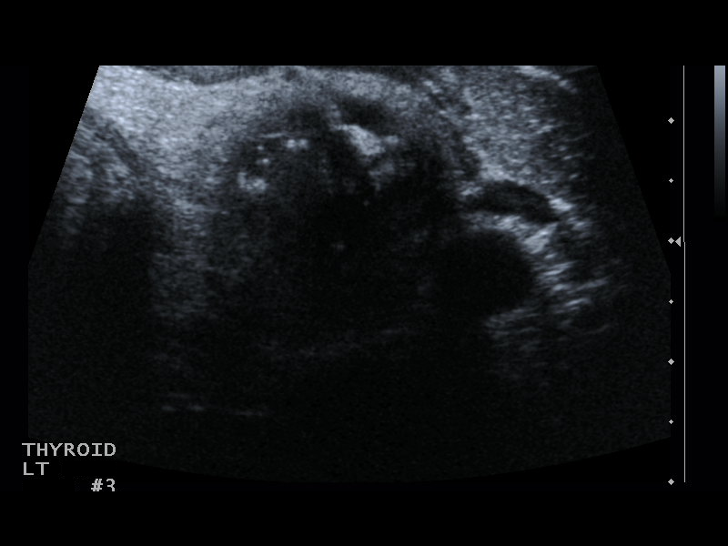
[im 14/20]
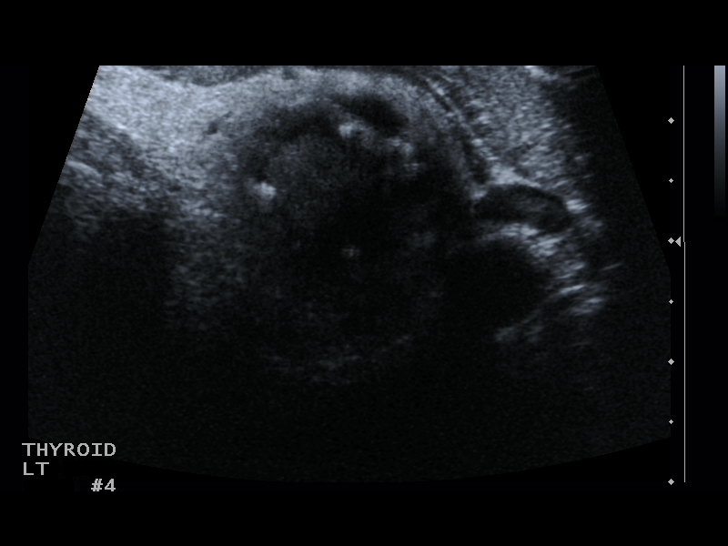
[im 15/20]
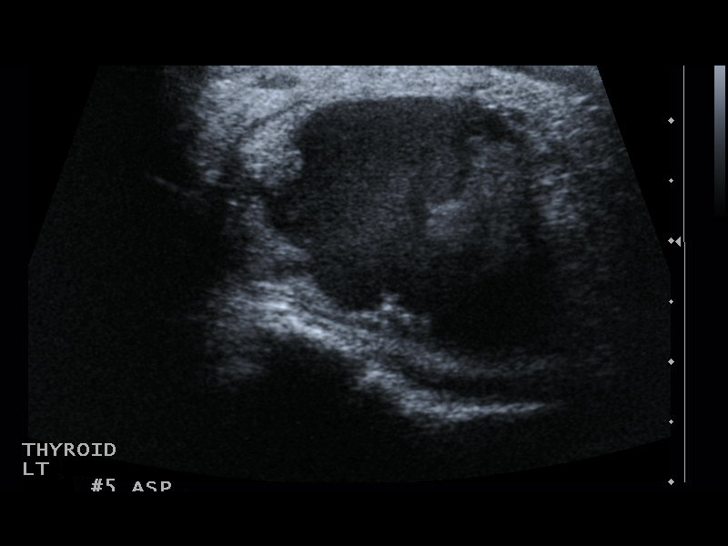
[im 17/20]
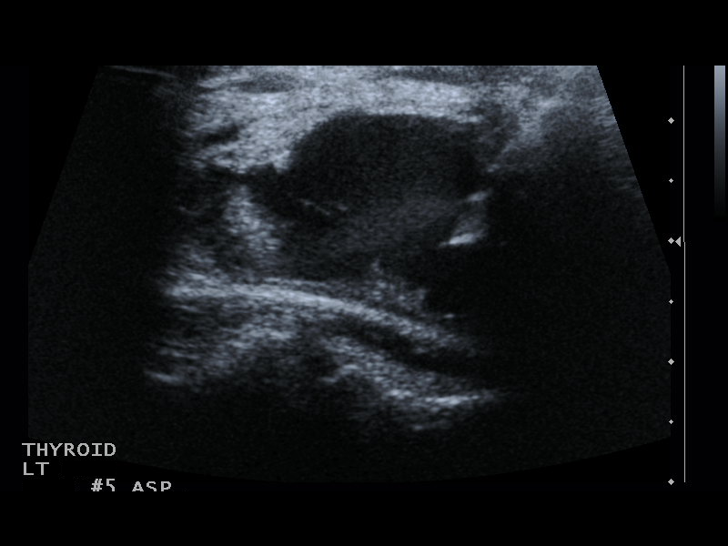
[im 18/20]
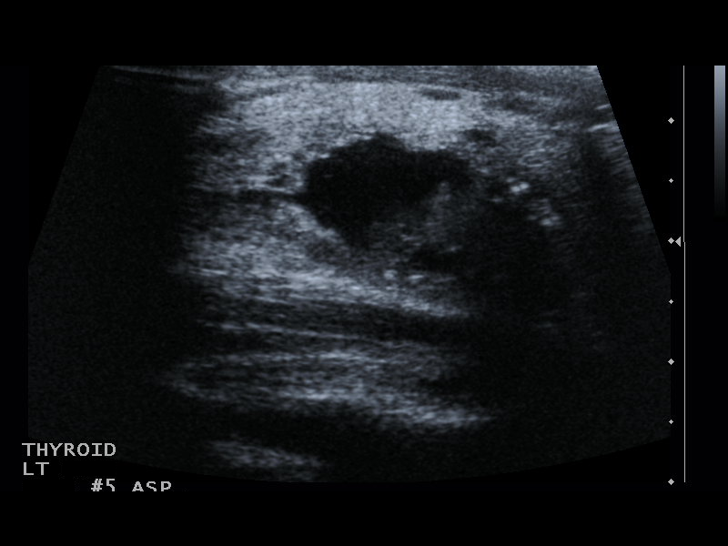
[im 20/20]
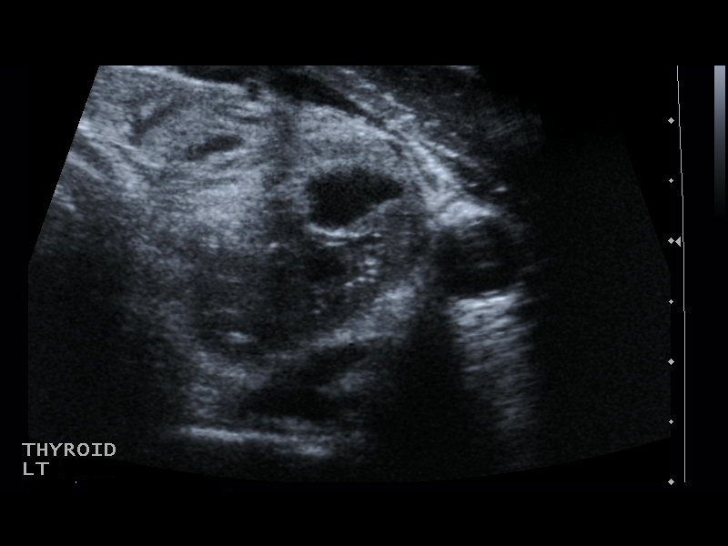

[13 of 20 positions shown; findings below may reference images not displayed]

Pre-procedural ultrasound scanning demonstrated large left thyroid
nodule, predominantly cystic in appearance measuring approximately
3.5 x 2.5 x 2.7 cm. There is scattered debris and small amount of
solid component in the inferior portion.

The procedure was planned. The neck was prepped in the usual sterile
fashion, and a sterile drape was applied covering the operative
field. A timeout was performed prior to the initiation of the
procedure. Local anesthesia was provided with 1% lidocaine.

Under direct ultrasound guidance, 4 FNA biopsies were performed of
the left thyroid nodule with a 27 gauge needle. An 18 gauge needle
was then used to aspirate approximately 9 mL of dark reddish-brown
cystic fluid. The samples were prepared and submitted to pathology.

Limited post procedural scanning was negative for hematoma or
additional complication. Dressings were placed. The patient
tolerated the above procedures procedure well without immediate
postprocedural complication.
IMPRESSION: Technically successful ultrasound guided fine needle aspiration of
left thyroid nodule as described above

## 2017-03-21 DIAGNOSIS — H1013 Acute atopic conjunctivitis, bilateral: Secondary | ICD-10-CM | POA: Diagnosis not present

## 2017-04-26 DIAGNOSIS — Z0000 Encounter for general adult medical examination without abnormal findings: Secondary | ICD-10-CM | POA: Diagnosis not present

## 2017-04-26 DIAGNOSIS — N183 Chronic kidney disease, stage 3 (moderate): Secondary | ICD-10-CM | POA: Diagnosis not present

## 2017-04-30 DIAGNOSIS — E041 Nontoxic single thyroid nodule: Secondary | ICD-10-CM | POA: Diagnosis not present

## 2017-04-30 DIAGNOSIS — Z0000 Encounter for general adult medical examination without abnormal findings: Secondary | ICD-10-CM | POA: Diagnosis not present

## 2017-04-30 DIAGNOSIS — M545 Low back pain: Secondary | ICD-10-CM | POA: Diagnosis not present

## 2017-04-30 DIAGNOSIS — N183 Chronic kidney disease, stage 3 (moderate): Secondary | ICD-10-CM | POA: Diagnosis not present

## 2017-04-30 DIAGNOSIS — Z1389 Encounter for screening for other disorder: Secondary | ICD-10-CM | POA: Diagnosis not present

## 2017-05-04 DIAGNOSIS — Z1212 Encounter for screening for malignant neoplasm of rectum: Secondary | ICD-10-CM | POA: Diagnosis not present

## 2017-05-11 DIAGNOSIS — G4700 Insomnia, unspecified: Secondary | ICD-10-CM | POA: Diagnosis not present

## 2017-05-14 DIAGNOSIS — G4700 Insomnia, unspecified: Secondary | ICD-10-CM | POA: Diagnosis not present

## 2017-06-10 DIAGNOSIS — R21 Rash and other nonspecific skin eruption: Secondary | ICD-10-CM | POA: Diagnosis not present

## 2017-06-19 DIAGNOSIS — L237 Allergic contact dermatitis due to plants, except food: Secondary | ICD-10-CM | POA: Diagnosis not present

## 2017-12-06 DIAGNOSIS — R238 Other skin changes: Secondary | ICD-10-CM | POA: Diagnosis not present

## 2017-12-06 DIAGNOSIS — L720 Epidermal cyst: Secondary | ICD-10-CM | POA: Diagnosis not present

## 2017-12-06 DIAGNOSIS — D225 Melanocytic nevi of trunk: Secondary | ICD-10-CM | POA: Diagnosis not present

## 2017-12-06 DIAGNOSIS — L570 Actinic keratosis: Secondary | ICD-10-CM | POA: Diagnosis not present

## 2018-03-18 DIAGNOSIS — H1013 Acute atopic conjunctivitis, bilateral: Secondary | ICD-10-CM | POA: Diagnosis not present

## 2018-05-02 DIAGNOSIS — R82998 Other abnormal findings in urine: Secondary | ICD-10-CM | POA: Diagnosis not present

## 2018-05-02 DIAGNOSIS — Z0000 Encounter for general adult medical examination without abnormal findings: Secondary | ICD-10-CM | POA: Diagnosis not present

## 2018-05-07 DIAGNOSIS — Z1389 Encounter for screening for other disorder: Secondary | ICD-10-CM | POA: Diagnosis not present

## 2018-05-07 DIAGNOSIS — R030 Elevated blood-pressure reading, without diagnosis of hypertension: Secondary | ICD-10-CM | POA: Diagnosis not present

## 2018-05-07 DIAGNOSIS — Z0000 Encounter for general adult medical examination without abnormal findings: Secondary | ICD-10-CM | POA: Diagnosis not present

## 2018-05-07 DIAGNOSIS — G4709 Other insomnia: Secondary | ICD-10-CM | POA: Diagnosis not present

## 2018-05-07 DIAGNOSIS — E041 Nontoxic single thyroid nodule: Secondary | ICD-10-CM | POA: Diagnosis not present

## 2018-05-08 DIAGNOSIS — Z1212 Encounter for screening for malignant neoplasm of rectum: Secondary | ICD-10-CM | POA: Diagnosis not present

## 2018-05-30 DIAGNOSIS — J209 Acute bronchitis, unspecified: Secondary | ICD-10-CM | POA: Diagnosis not present

## 2018-05-30 DIAGNOSIS — R05 Cough: Secondary | ICD-10-CM | POA: Diagnosis not present

## 2018-05-30 DIAGNOSIS — Z6823 Body mass index (BMI) 23.0-23.9, adult: Secondary | ICD-10-CM | POA: Diagnosis not present

## 2018-06-18 DIAGNOSIS — R030 Elevated blood-pressure reading, without diagnosis of hypertension: Secondary | ICD-10-CM | POA: Diagnosis not present

## 2018-06-19 DIAGNOSIS — R030 Elevated blood-pressure reading, without diagnosis of hypertension: Secondary | ICD-10-CM | POA: Diagnosis not present

## 2018-07-04 DIAGNOSIS — I10 Essential (primary) hypertension: Secondary | ICD-10-CM | POA: Diagnosis not present

## 2018-07-04 DIAGNOSIS — R05 Cough: Secondary | ICD-10-CM | POA: Diagnosis not present

## 2018-07-04 DIAGNOSIS — M545 Low back pain: Secondary | ICD-10-CM | POA: Diagnosis not present

## 2018-07-04 DIAGNOSIS — Z23 Encounter for immunization: Secondary | ICD-10-CM | POA: Diagnosis not present

## 2018-11-21 ENCOUNTER — Ambulatory Visit (INDEPENDENT_AMBULATORY_CARE_PROVIDER_SITE_OTHER): Payer: 59 | Admitting: Internal Medicine

## 2018-11-21 ENCOUNTER — Encounter: Payer: Self-pay | Admitting: Internal Medicine

## 2018-11-21 VITALS — BP 122/74 | HR 66 | Ht 73.0 in | Wt 181.2 lb

## 2018-11-21 DIAGNOSIS — Z8673 Personal history of transient ischemic attack (TIA), and cerebral infarction without residual deficits: Secondary | ICD-10-CM

## 2018-11-21 DIAGNOSIS — Z7902 Long term (current) use of antithrombotics/antiplatelets: Secondary | ICD-10-CM | POA: Diagnosis not present

## 2018-11-21 DIAGNOSIS — Z86010 Personal history of colonic polyps: Secondary | ICD-10-CM | POA: Diagnosis not present

## 2018-11-21 MED ORDER — NA SULFATE-K SULFATE-MG SULF 17.5-3.13-1.6 GM/177ML PO SOLN: 1.0000 | Freq: Once | ORAL | 0 refills | Status: AC

## 2018-11-21 NOTE — Patient Instructions (Signed)
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
## 2018-11-26 ENCOUNTER — Encounter: Payer: Self-pay | Admitting: Internal Medicine

## 2018-11-26 ENCOUNTER — Ambulatory Visit (AMBULATORY_SURGERY_CENTER): Payer: 59 | Admitting: Internal Medicine

## 2018-11-26 VITALS — BP 127/67 | HR 57 | Temp 98.6°F | Resp 18 | Ht 73.0 in | Wt 181.0 lb

## 2018-11-26 DIAGNOSIS — D123 Benign neoplasm of transverse colon: Secondary | ICD-10-CM

## 2018-11-26 DIAGNOSIS — D122 Benign neoplasm of ascending colon: Secondary | ICD-10-CM

## 2018-11-26 DIAGNOSIS — Z86010 Personal history of colonic polyps: Secondary | ICD-10-CM

## 2018-11-26 MED ORDER — SODIUM CHLORIDE 0.9 % IV SOLN: 500.0000 mL | Freq: Once | INTRAVENOUS | Status: DC

## 2018-11-26 NOTE — Op Note (Signed)
[place] Patient Name: Daniel Wu Procedure Date: 11/26/2018 8:09 AM MRN: [id number] Endoscopist: Docia Chuck. Henrene Pastor , MD Age: 63 Referring MD:  Date of Birth: [birth year] Gender: Male Account #: [id number] Procedure:                Colonoscopy with cold snare polypectomy x 3 Indications:              High risk colon cancer surveillance: Personal                            history of adenoma (10 mm or greater in size), High                            risk colon cancer surveillance: Personal history of                            multiple (3 or more) adenomas. Previous                            examinations 1996, 1997, 2002, 2005, 2010, 2015 Medicines:                Monitored Anesthesia Care Procedure:                Pre-Anesthesia Assessment:                           - Prior to the procedure, a History and Physical                            was performed, and patient medications and                            allergies were reviewed. The patient's tolerance of                            previous anesthesia was also reviewed. The risks                            and benefits of the procedure and the sedation                            options and risks were discussed with the patient.                            All questions were answered, and informed consent                            was obtained. Prior Anticoagulants: The patient has                            taken Plavix (clopidogrel), last dose was day of                            procedure. ASA Grade Assessment: III - A patient  with severe systemic disease. After reviewing the                            risks and benefits, the patient was deemed in                            satisfactory condition to undergo the procedure.                           After obtaining informed consent, the colonoscope                            was passed under direct vision. Throughout the            procedure, the patient's blood pressure, pulse, and                            oxygen saturations were monitored continuously. The                            Colonoscope was introduced through the anus and                            advanced to the the cecum, identified by                            appendiceal orifice and ileocecal valve. The                            ileocecal valve, appendiceal orifice, and rectum                            were photographed. The quality of the bowel                            preparation was excellent. The colonoscopy was                            performed without difficulty. The patient tolerated                            the procedure well. The bowel preparation used was                            SUPREP. Scope In: 8:14:59 AM Scope Out: 8:28:01 AM Scope Withdrawal Time: 0 hours 9 minutes 45 seconds  Total Procedure Duration: 0 hours 13 minutes 2 seconds  Findings:                 Three polyps were found in the transverse colon and                            ascending colon. The polyps were 3 mm in size.  These polyps were removed with a cold snare.                            Resection and retrieval were complete.                           Internal hemorrhoids were found during                            retroflexion. The hemorrhoids were moderate.                           The exam was otherwise without abnormality on                            direct and retroflexion views. Complications:            No immediate complications. Estimated blood loss:                            None. Estimated Blood Loss:     Estimated blood loss: none. Impression:               - Three 3 mm polyps in the transverse colon and in                            the ascending colon, removed with a cold snare.                            Resected and retrieved.                           - Internal hemorrhoids.                            - The examination was otherwise normal on direct                            and retroflexion views. Recommendation:           - Repeat colonoscopy in 5 years for surveillance.                           - Continue Plavix without interruption.                           - Patient has a contact number available for                            emergencies. The signs and symptoms of potential                            delayed complications were discussed with the                            patient. Return to normal activities tomorrow.  Written discharge instructions were provided to the                            patient.                           - Resume previous diet.                           - Continue present medications.                           - Await pathology results. Docia Chuck. Henrene Pastor, MD 11/26/2018 8:35:37 AM This report has been signed electronically.

## 2018-11-26 NOTE — Progress Notes (Signed)
Called to room to assist during endoscopic procedure.  Patient ID and intended procedure confirmed with present staff. Received instructions for my participation in the procedure from the performing physician.  

## 2018-11-26 NOTE — Patient Instructions (Signed)
Thank you for allowing [place] to care for you today!  Await pathology results by mail, approximately 2 weeks.  Repeat colonoscopy in 5 years for surveillance.    Continue Plavix without interruption.  Resume previous diet and medications today.  Return to normal activities tomorrow.    YOU HAD AN ENDOSCOPIC PROCEDURE TODAY AT [facility]:   Refer to the procedure report that was given to you for any specific questions about what was found during the examination.  If the procedure report does not answer your questions, please call your gastroenterologist to clarify.  If you requested that your care partner not be given the details of your procedure findings, then the procedure report has been included in a sealed envelope for you to review at your convenience later.  YOU SHOULD EXPECT: Some feelings of bloating in the abdomen. Passage of more gas than usual.  Walking can help get rid of the air that was put into your GI tract during the procedure and reduce the bloating. If you had a lower endoscopy (such as a colonoscopy or flexible sigmoidoscopy) you may notice spotting of blood in your stool or on the toilet paper. If you underwent a bowel prep for your procedure, you may not have a normal bowel movement for a few days.  Please Note:  You might notice some irritation and congestion in your nose or some drainage.  This is from the oxygen used during your procedure.  There is no need for concern and it should clear up in a day or so.  SYMPTOMS TO REPORT IMMEDIATELY:   Following lower endoscopy (colonoscopy or flexible sigmoidoscopy):  Excessive amounts of blood in the stool  Significant tenderness or worsening of abdominal pains  Swelling of the abdomen that is new, acute  Fever of 100F or higher    For urgent or emergent issues, a gastroenterologist can be reached at any hour by calling [phone].   DIET:  We do recommend a small meal at first, but then you may  proceed to your regular diet.  Drink plenty of fluids but you should avoid alcoholic beverages for 24 hours.  ACTIVITY:  You should plan to take it easy for the rest of today and you should NOT DRIVE or use heavy machinery until tomorrow (because of the sedation medicines used during the test).    FOLLOW UP: Our staff will call the number listed on your records the next business day following your procedure to check on you and address any questions or concerns that you may have regarding the information given to you following your procedure. If we do not reach you, we will leave a message.  However, if you are feeling well and you are not experiencing any problems, there is no need to return our call.  We will assume that you have returned to your regular daily activities without incident.  If any biopsies were taken you will be contacted by phone or by letter within the next 1-3 weeks.  Please call us at [phone] if you have not heard about the biopsies in 3 weeks.    SIGNATURES/CONFIDENTIALITY: You and/or your care partner have signed paperwork which will be entered into your electronic medical record.  These signatures attest to the fact that that the information above on your After Visit Summary has been reviewed and is understood.  Full responsibility of the confidentiality of this discharge information lies with you and/or your care-partner.

## 2018-11-26 NOTE — Progress Notes (Signed)
Pt awake. VSS. Report given to RN. No anesthetic complications noted 

## 2018-11-27 ENCOUNTER — Telehealth: Payer: Self-pay

## 2018-11-27 NOTE — Telephone Encounter (Signed)
  Follow up Call-  Call back number 11/26/2018  Post procedure Call Back phone  # [phone]  Permission to leave phone message Yes  Some recent data might be hidden     Patient questions:  Do you have a fever, pain , or abdominal swelling? No. Pain Score  0 *  Have you tolerated food without any problems? Yes.    Have you been able to return to your normal activities? Yes.    Do you have any questions about your discharge instructions: Diet   No. Medications  No. Follow up visit  No.  Do you have questions or concerns about your Care? No.  Actions: * If pain score is 4 or above: No action needed, pain <4.

## 2018-11-28 ENCOUNTER — Encounter: Payer: Self-pay | Admitting: Internal Medicine

## 2019-04-04 ENCOUNTER — Encounter (HOSPITAL_COMMUNITY): Payer: Self-pay | Admitting: Emergency Medicine

## 2019-04-04 ENCOUNTER — Other Ambulatory Visit: Payer: Self-pay

## 2019-04-04 ENCOUNTER — Emergency Department (HOSPITAL_COMMUNITY): Payer: Commercial Managed Care - PPO

## 2019-04-04 ENCOUNTER — Ambulatory Visit (INDEPENDENT_AMBULATORY_CARE_PROVIDER_SITE_OTHER)
Admission: EM | Admit: 2019-04-04 | Discharge: 2019-04-04 | Disposition: A | Discharge status: Home / Self Care | Payer: Commercial Managed Care - PPO | Source: Home / Self Care | Attending: Family Medicine | Admitting: Family Medicine

## 2019-04-04 ENCOUNTER — Emergency Department (HOSPITAL_COMMUNITY)
Admission: EM | Admit: 2019-04-04 | Discharge: 2019-04-04 | Disposition: A | Discharge status: Home / Self Care | Payer: Commercial Managed Care - PPO | Attending: Emergency Medicine | Admitting: Emergency Medicine

## 2019-04-04 DIAGNOSIS — R079 Chest pain, unspecified: Secondary | ICD-10-CM

## 2019-04-04 DIAGNOSIS — I10 Essential (primary) hypertension: Secondary | ICD-10-CM | POA: Insufficient documentation

## 2019-04-04 DIAGNOSIS — R0789 Other chest pain: Secondary | ICD-10-CM | POA: Diagnosis not present

## 2019-04-04 DIAGNOSIS — Z7902 Long term (current) use of antithrombotics/antiplatelets: Secondary | ICD-10-CM | POA: Insufficient documentation

## 2019-04-04 DIAGNOSIS — Z8673 Personal history of transient ischemic attack (TIA), and cerebral infarction without residual deficits: Secondary | ICD-10-CM | POA: Diagnosis not present

## 2019-04-04 DIAGNOSIS — Z79899 Other long term (current) drug therapy: Secondary | ICD-10-CM | POA: Insufficient documentation

## 2019-04-04 LAB — CBC
HCT: 46.3 % (ref 39.0–52.0)
Hemoglobin: 15.1 g/dL (ref 13.0–17.0)
MCH: 30.6 pg (ref 26.0–34.0)
MCHC: 32.6 g/dL (ref 30.0–36.0)
MCV: 93.9 fL (ref 80.0–100.0)
Platelets: 178 10*3/uL (ref 150–400)
RBC: 4.93 MIL/uL (ref 4.22–5.81)
RDW: 11.6 % (ref 11.5–15.5)
WBC: 9 10*3/uL (ref 4.0–10.5)
nRBC: 0 % (ref 0.0–0.2)

## 2019-04-04 LAB — BASIC METABOLIC PANEL
Anion gap: 8 (ref 5–15)
BUN: 19 mg/dL (ref 8–23)
CO2: 26 mmol/L (ref 22–32)
Calcium: 9.5 mg/dL (ref 8.9–10.3)
Chloride: 104 mmol/L (ref 98–111)
Creatinine, Ser: 1.18 mg/dL (ref 0.61–1.24)
GFR calc Af Amer: >60 mL/min (ref >60)
GFR calc non Af Amer: >60 mL/min (ref >60)
Glucose, Bld: 101 mg/dL — ABNORMAL HIGH (ref 70–99)
Potassium: 4.2 mmol/L (ref 3.5–5.1)
Sodium: 138 mmol/L (ref 135–145)

## 2019-04-04 LAB — TROPONIN I (HIGH SENSITIVITY)
Troponin I (High Sensitivity): 7 ng/L (ref <18)
Troponin I (High Sensitivity): 7 ng/L (ref <18)

## 2019-04-04 MED ORDER — SODIUM CHLORIDE 0.9% FLUSH: 3.0000 mL | Freq: Once | INTRAVENOUS | Status: DC

## 2019-04-04 MED ORDER — ALUM & MAG HYDROXIDE-SIMETH 200-200-20 MG/5ML PO SUSP
ORAL | Status: AC
  Filled 2019-04-04: qty 30

## 2019-04-04 MED ORDER — ALUM & MAG HYDROXIDE-SIMETH 200-200-20 MG/5ML PO SUSP
30.0000 mL | Freq: Once | ORAL | Status: AC
  Administered 2019-04-04: 12:00:00 30 mL via ORAL

## 2019-04-04 MED ORDER — FAMOTIDINE 20 MG PO TABS
40.0000 mg | ORAL_TABLET | Freq: Once | ORAL | Status: AC
  Administered 2019-04-04: 15:00:00 40 mg via ORAL
  Filled 2019-04-04: qty 2

## 2019-04-04 MED ORDER — FAMOTIDINE 20 MG PO TABS: 20.0000 mg | ORAL_TABLET | Freq: Two times a day (BID) | ORAL | 0 refills | Status: DC

## 2019-04-04 NOTE — ED Triage Notes (Signed)
Pt here for generalized CP and pressure x 4 days worse when laying flat; pt sts mild cough denies fever

## 2019-04-04 NOTE — ED Provider Notes (Signed)
[facility]    CSN: [id number] Arrival date & time: 04/04/19  1023     History   Chief Complaint Chief Complaint  Patient presents with  . Chest Pain    HPI Daniel Wu is a 63 y.o. male.   Patient reports 4-5 day history of intermittent chest pain/pressure and upper abdominal pain.  Nonradiating; pain does not change with activity; no aggravating factors known; moderate improvement with eating food.  Pain currently 2/10 which is the worst it has been; at best it is a 0/10.  Denies shortness of breath, nausea, vomiting, diaphoresis.  Patient has history of hypertension, high cholesterol, and is on Plavix for previous TIA in 2016.   The history is provided by the patient.    Past Medical History:  Diagnosis Date  . Allergy   . Anal fissure   . Colon polyps   . GERD (gastroesophageal reflux disease)   . Headache   . Hemorrhoids   . High blood pressure   . HLD (hyperlipidemia)   . Seasonal allergies   . Syncope   . TIA (transient ischemic attack) 2016    Patient Active Problem List   Diagnosis Date Noted  . Mitral valve prolapse 11/06/2014  . Chest pain 10/12/2014  . Dizziness 10/12/2014  . Orthostatic hypotension 09/23/2013  . HEMORRHOIDS-INTERNAL 11/23/2008  . RECTAL BLEEDING 11/23/2008  . PERSONAL HX COLONIC POLYPS 11/23/2008    Past Surgical History:  Procedure Laterality Date  . COLONOSCOPY W/ POLYPECTOMY    . RHINOPLASTY         Home Medications    Prior to Admission medications   Medication Sig Start Date End Date Taking? Authorizing Provider  clopidogrel (PLAVIX) 75 MG tablet  03/04/15   [provider]  olmesartan (BENICAR) 20 MG tablet Take 20 mg by mouth daily.    [provider]  pravastatin (PRAVACHOL) 20 MG tablet Take 20 mg by mouth daily.    [provider]    Family History Family History  Problem Relation Age of Onset  . Breast cancer Mother   . Hypertension Mother   . Diabetes Mother   .  Stomach cancer Father   . Prostate cancer Father   . Lung cancer Brother   . Colon polyps Neg Hx   . Rectal cancer Neg Hx   . Esophageal cancer Neg Hx     Social History Social History   Tobacco Use  . Smoking status: Never Smoker  . Smokeless tobacco: Never Used  Substance Use Topics  . Alcohol use: Yes    Alcohol/week: 2.0 standard drinks    Types: 2 Standard drinks or equivalent per week    Comment: Socially - beer 4-5 per month  . Drug use: No     Allergies   Penicillins cross reactors   Review of Systems Review of Systems  Constitutional: Negative for chills and fever.  HENT: Negative for ear pain and sore throat.   Eyes: Negative for pain and visual disturbance.  Respiratory: Negative for cough and shortness of breath.   Cardiovascular: Positive for chest pain. Negative for palpitations.  Gastrointestinal: Negative for abdominal pain and vomiting.  Genitourinary: Negative for dysuria and hematuria.  Musculoskeletal: Negative for arthralgias and back pain.  Skin: Negative for color change and rash.  Neurological: Negative for seizures and syncope.  All other systems reviewed and are negative.    Physical Exam Triage Vital Signs ED Triage Vitals [04/04/19 1040]  Enc Vitals Group  BP (!) 142/72     Pulse Rate 84     Resp 18     Temp 98.4 F (36.9 C)     Temp Source Oral     SpO2 100 %     Weight      Height      Head Circumference      Peak Flow      Pain Score 4     Pain Loc      Pain Edu?      Excl. in [place]?    No data found.  Updated Vital Signs BP (!) 142/72 (BP Location: Right Arm)   Pulse 84   Temp 98.4 F (36.9 C) (Oral)   Resp 18   SpO2 100%   Visual Acuity Right Eye Distance:   Left Eye Distance:   Bilateral Distance:    Right Eye Near:   Left Eye Near:    Bilateral Near:     Physical Exam Vitals signs and nursing note reviewed.  Constitutional:      Appearance: He is well-developed.  HENT:     Head: Normocephalic and  atraumatic.  Eyes:     Conjunctiva/sclera: Conjunctivae normal.  Neck:     Musculoskeletal: Neck supple.  Cardiovascular:     Rate and Rhythm: Normal rate and regular rhythm.     Heart sounds: No murmur.  Pulmonary:     Effort: Pulmonary effort is normal. No respiratory distress.     Breath sounds: Normal breath sounds.  Chest:     Chest wall: No tenderness.  Abdominal:     Palpations: Abdomen is soft.     Tenderness: There is no abdominal tenderness.  Musculoskeletal:        General: No tenderness.  Skin:    General: Skin is warm and dry.  Neurological:     Mental Status: He is alert.      UC Treatments / Results  Labs (all labs ordered are listed, but only abnormal results are displayed) Labs Reviewed - No data to display  EKG   Radiology No results found.  Procedures Procedures (including critical care time)  Medications Ordered in UC Medications  alum & mag hydroxide-simeth (MAALOX/MYLANTA) 200-200-20 MG/5ML suspension 30 mL (30 mLs Oral Given 04/04/19 1204)  alum & mag hydroxide-simeth (MAALOX/MYLANTA) 200-200-20 MG/5ML suspension (has no administration in time range)    Initial Impression / Assessment and Plan / UC Course  I have reviewed the triage vital signs and the nursing notes.  Pertinent labs & imaging results that were available during my care of the patient were reviewed by me and considered in my medical decision making (see chart for details).   Chest pain.  PVCs noted on EKG.  Pain not relieved with Mylanta.  3 of hypertension, high cholesterol, and TIA.  Discussed with Dr. Mannie Stabile and decision made to send patient to emergency department for evaluation.  Final Clinical Impressions(s) / UC Diagnoses   Final diagnoses:  Chest pain, unspecified type     Discharge Instructions     Go to ER.    ED Prescriptions    None     Controlled Substance Prescriptions [place] Controlled Substance Registry consulted? Not Applicable   Sharion Balloon,  NP 04/04/19 1227

## 2019-04-04 NOTE — Discharge Instructions (Signed)
Start taking Pepcid twice daily.  Avoid fried foods, spicy foods, fatty foods, or alcohol.  I have attached information for diet to reduce reflux disease.  Alternatively, you can take 1 to 2 tablets of Tylenol every 6 hours as needed for pain.  Follow-up with your primary care provider or a cardiologist for reevaluation of your symptoms.  Return to the emergency department if any concerning signs or symptoms develop such as severe chest pains, persistent shortness of breath, fevers, or passing out.

## 2019-04-04 NOTE — Discharge Instructions (Addendum)
Go to ER

## 2019-04-04 NOTE — ED Notes (Signed)
Patient is being discharged from the Urgent [facility] and sent to the Emergency Department via wheelchair by staff. Per provider Barkley Boards patient is stable but in need of higher level of care due to Chest Pain. Patient is aware and verbalizes understanding of plan of care.   Vitals:   04/04/19 1040  BP: (!) 142/72  Pulse: 84  Resp: 18  Temp: 98.4 F (36.9 C)  SpO2: 100%

## 2019-04-04 NOTE — ED Provider Notes (Signed)
[facility] EMERGENCY DEPARTMENT Provider Note   CSN: [id number] Arrival date & time: 04/04/19  1227    History   Chief Complaint Chief Complaint  Patient presents with  . Chest Pain    HPI Daniel Wu is a 63 y.o. male with history of GERD, headache, hyperlipidemia, syncope, TIA presents for evaluation of acute onset, intermittent chest pain for 1 week.  Pain is described as a squeezing tightness, does not radiate.  Denies any associated shortness of breath, nausea, vomiting, lightheadedness, syncope, or diaphoresis.  Denies abdominal pain.  He does report the pain worsens prior to meals and improves with meals.  He went to an urgent care earlier today and was sent to the ED for further evaluation.  He was given Maalox at the urgent care with no improvement in his symptoms.  At worst, the pain is 2/10 in severity.  He reports he has had similar pains intermittently for years but they are just more persistent this week.  He does report that his diet lately has been "worse "than usual due to the COVID-19 infection.  Pain is not exertional or pleuritic.  He is a non-smoker, no recreational drug use, does not drink alcohol.  He has seen a cardiologist in 2016 after a TIA work-up and had a reassuring stress test at the time per the patient.    The history is provided by the patient.    Past Medical History:  Diagnosis Date  . Allergy   . Anal fissure   . Colon polyps   . GERD (gastroesophageal reflux disease)   . Headache   . Hemorrhoids   . High blood pressure   . HLD (hyperlipidemia)   . Seasonal allergies   . Syncope   . TIA (transient ischemic attack) 2016    Patient Active Problem List   Diagnosis Date Noted  . Mitral valve prolapse 11/06/2014  . Chest pain 10/12/2014  . Dizziness 10/12/2014  . Orthostatic hypotension 09/23/2013  . HEMORRHOIDS-INTERNAL 11/23/2008  . RECTAL BLEEDING 11/23/2008  . PERSONAL HX COLONIC POLYPS 11/23/2008    Past Surgical  History:  Procedure Laterality Date  . COLONOSCOPY W/ POLYPECTOMY    . RHINOPLASTY          Home Medications    Prior to Admission medications   Medication Sig Start Date End Date Taking? Authorizing Provider  acetaminophen (TYLENOL) 500 MG tablet Take 1,000 mg by mouth every 6 (six) hours as needed for mild pain.   Yes [provider]  cetirizine (ZYRTEC) 10 MG tablet Take 10 mg by mouth daily as needed for allergies.   Yes [provider]  clopidogrel (PLAVIX) 75 MG tablet Take 75 mg by mouth daily.  03/04/15  Yes [provider]  olmesartan (BENICAR) 20 MG tablet Take 20 mg by mouth daily.   Yes [provider]  pravastatin (PRAVACHOL) 20 MG tablet Take 20 mg by mouth daily.   Yes [provider]  famotidine (PEPCID) 20 MG tablet Take 1 tablet (20 mg total) by mouth 2 (two) times daily. 04/04/19   Renita Papa, PA-C    Family History Family History  Problem Relation Age of Onset  . Breast cancer Mother   . Hypertension Mother   . Diabetes Mother   . Stomach cancer Father   . Prostate cancer Father   . Lung cancer Brother   . Colon polyps Neg Hx   . Rectal cancer Neg Hx   . Esophageal cancer Neg  Hx     Social History Social History   Tobacco Use  . Smoking status: Never Smoker  . Smokeless tobacco: Never Used  Substance Use Topics  . Alcohol use: Yes    Alcohol/week: 2.0 standard drinks    Types: 2 Standard drinks or equivalent per week    Comment: Socially - beer 4-5 per month  . Drug use: No     Allergies   Penicillins cross reactors   Review of Systems Review of Systems  Constitutional: Negative for chills and fever.  Respiratory: Negative for shortness of breath.   Cardiovascular: Positive for chest pain. Negative for palpitations and leg swelling.  Gastrointestinal: Negative for abdominal pain, nausea and vomiting.  All other systems reviewed and are negative.    Physical Exam Updated Vital Signs BP (!)  145/87   Pulse 82   Temp 99.7 F (37.6 C) (Oral)   Resp 16   Ht 6\' 2"  (1.88 m)   Wt 77.1 kg   SpO2 98%   BMI 21.83 kg/m   Physical Exam Vitals signs and nursing note reviewed.  Constitutional:      General: He is not in acute distress.    Appearance: He is well-developed.  HENT:     Head: Normocephalic and atraumatic.  Eyes:     General:        Right eye: No discharge.        Left eye: No discharge.     Extraocular Movements: Extraocular movements intact.     Conjunctiva/sclera: Conjunctivae normal.     Pupils: Pupils are equal, round, and reactive to light.  Neck:     Musculoskeletal: Normal range of motion and neck supple.     Vascular: No JVD.     Trachea: No tracheal deviation.  Cardiovascular:     Rate and Rhythm: Normal rate and regular rhythm.     Comments: 2+ radial and DP/PT pulses bilaterally, Homans sign absent bilaterally, no lower extremity edema, no palpable cords, compartments are soft  Pulmonary:     Effort: Pulmonary effort is normal.  Abdominal:     General: There is no distension.  Skin:    General: Skin is warm and dry.     Findings: No erythema.  Neurological:     Mental Status: He is alert.  Psychiatric:        Behavior: Behavior normal.      ED Treatments / Results  Labs (all labs ordered are listed, but only abnormal results are displayed) Labs Reviewed  BASIC METABOLIC PANEL - Abnormal; Notable for the following components:      Result Value   Glucose, Bld 101 (*)    All other components within normal limits  CBC  TROPONIN I (HIGH SENSITIVITY)  TROPONIN I (HIGH SENSITIVITY)    EKG EKG Interpretation  Date/Time:  Friday April 04 2019 12:29:53 EDT Ventricular Rate:  99 PR Interval:  150 QRS Duration: 92 QT Interval:  340 QTC Calculation: 436 R Axis:   52 Text Interpretation:  Sinus rhythm with Possible Premature atrial complexes with Abberant conduction Otherwise normal ECG Confirmed by Lacretia Leigh ([id number]) on 04/04/2019  2:31:54 PM   Radiology Dg Chest 2 View  Result Date: 04/04/2019 CLINICAL DATA:  Chest pain. EXAM: CHEST - 2 VIEW COMPARISON:  Radiograph of November 14, 2005. FINDINGS: The heart size and mediastinal contours are within normal limits. Both lungs are clear. No pneumothorax or pleural effusion is noted. Old left rib fractures are noted. IMPRESSION: No active  cardiopulmonary disease. Electronically Signed   By: Marijo Conception M.D.   On: 04/04/2019 13:25    Procedures Procedures (including critical care time)  Medications Ordered in ED Medications  famotidine (PEPCID) tablet 40 mg (40 mg Oral Given 04/04/19 1506)     Initial Impression / Assessment and Plan / ED Course  I have reviewed the triage vital signs and the nursing notes.  Pertinent labs & imaging results that were available during my care of the patient were reviewed by me and considered in my medical decision making (see chart for details).        Patient presenting for evaluation of intermittent chest pains for 1 week.  He is afebrile, vital signs are stable.  He is nontoxic in appearance.  Pain is not pleuritic, exertional, or reproducible on palpation.  EKG shows normal sinus rhythm with possible premature atrial complexes chest x-ray shows no acute cardiopulmonary abnormalities.  Blood work reviewed by me shows no leukocytosis, no anemia, no metabolic derangements, no renal insufficiency.  Serial troponins are negative.  No evidence of ACS/MI, dissection, PE, esophageal rupture, pneumonia, pneumothorax, CHF, or cardiac tamponade.   On reevaluation patient resting comfortably no apparent distress.His symptoms are somewhat suggestive of GERD. Discussed dietary modifications, will start short course of Pepcid.  Recommend follow-up with PCP/cardiologist on an outpatient basis.  Discussed strict ED return precautions. Pt verbalized understanding of and agreement with plan and is safe for discharge home at this time.   Final Clinical  Impressions(s) / ED Diagnoses   Final diagnoses:  Atypical chest pain    ED Discharge Orders         Ordered    famotidine (PEPCID) 20 MG tablet  2 times daily     04/04/19 1551           Renita Papa, PA-C 04/05/19 9622    Lacretia Leigh, MD 04/06/19 [phone]

## 2019-04-04 NOTE — ED Triage Notes (Signed)
Non radiating substernal chest pain for approx a week. [name] sob/n/v/d or fevers. Has attempted Malox with no resolve. Hx of TIA on blood thinner.

## 2019-04-04 NOTE — ED Notes (Signed)
Pt discharged with all belongings. Discharge instructions reviewed with pt, and pt verbalized understanding. Opportunity for questions provided.  

## 2019-10-18 IMAGING — CR CHEST - 2 VIEW
2 series · 2 of 2 positions shown · non-contrast
Comparison: Radiograph November 14, 2005.

CLINICAL DATA: Chest pain.

EXAM:
CHEST - 2 VIEW

[chest pa]
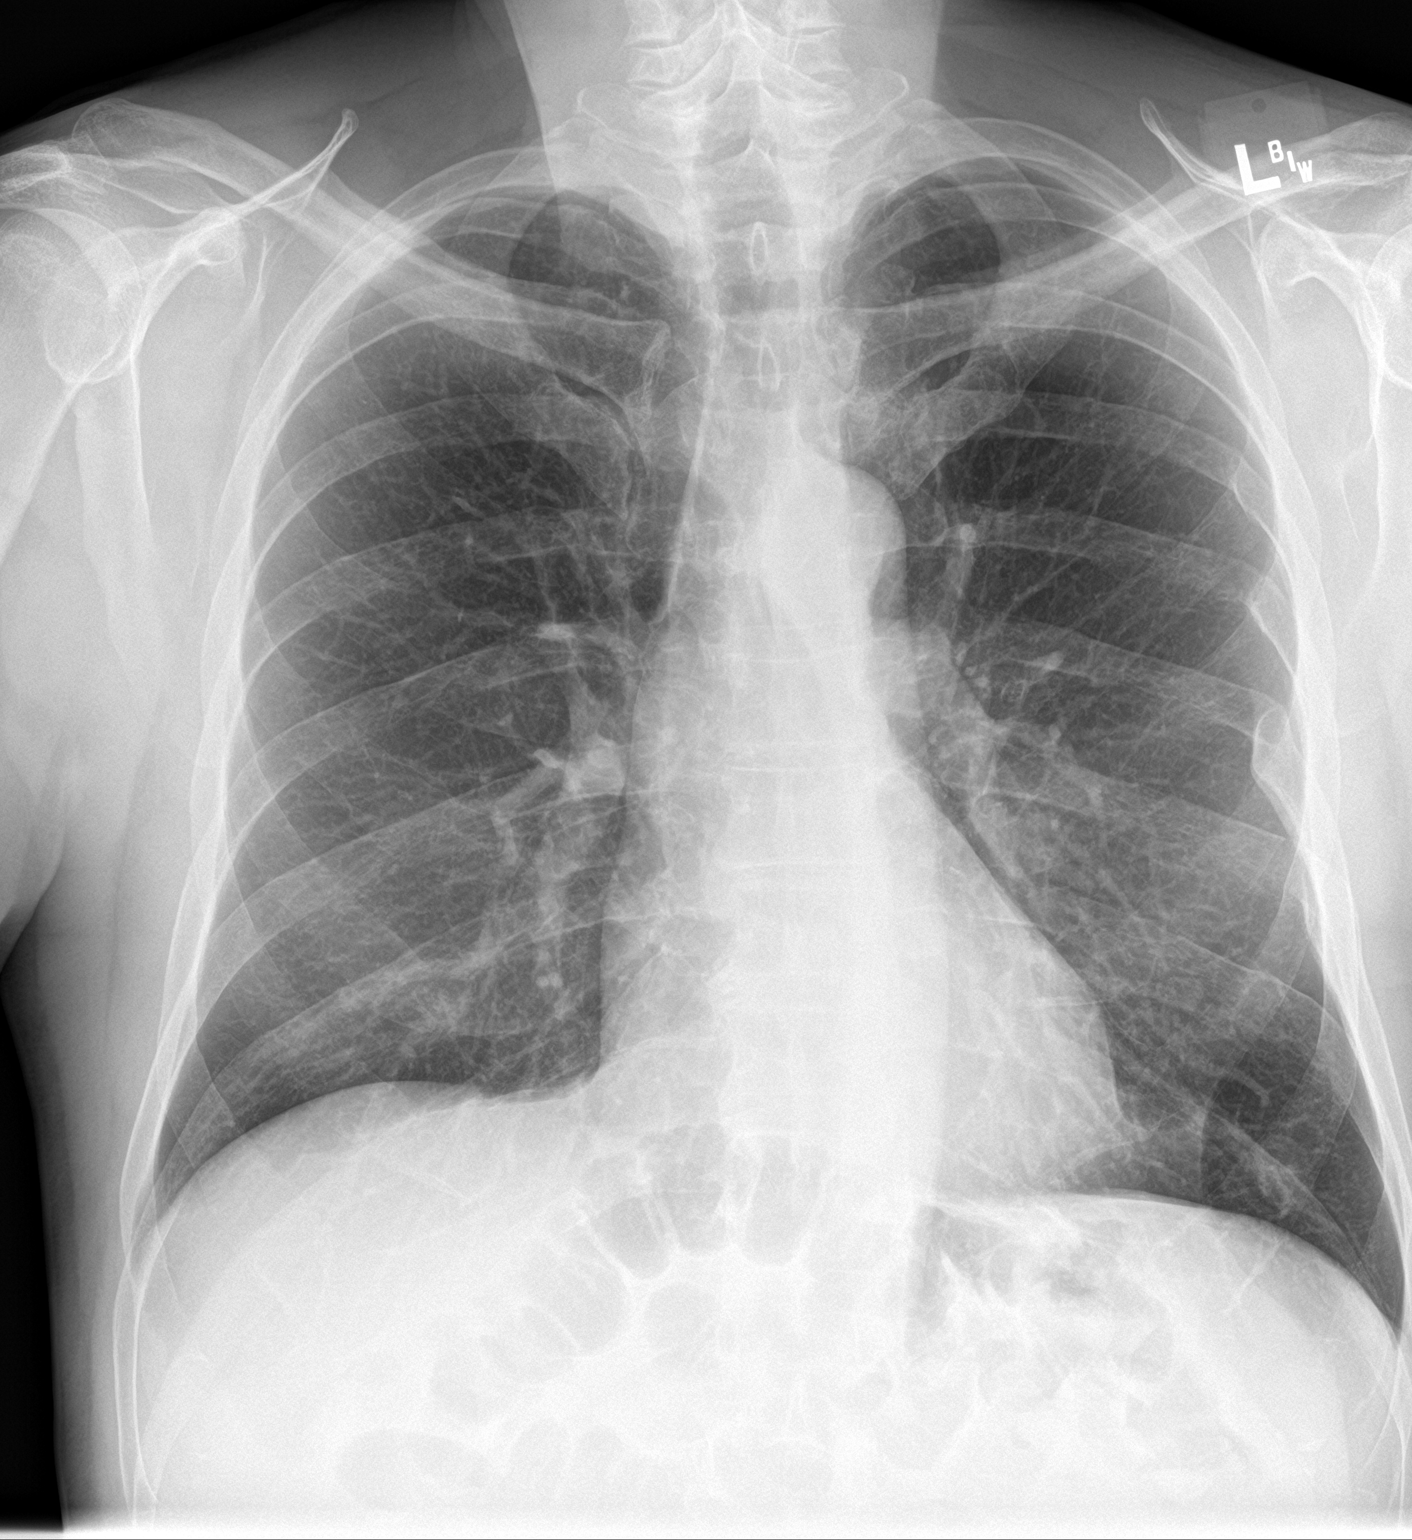

[chest lat]
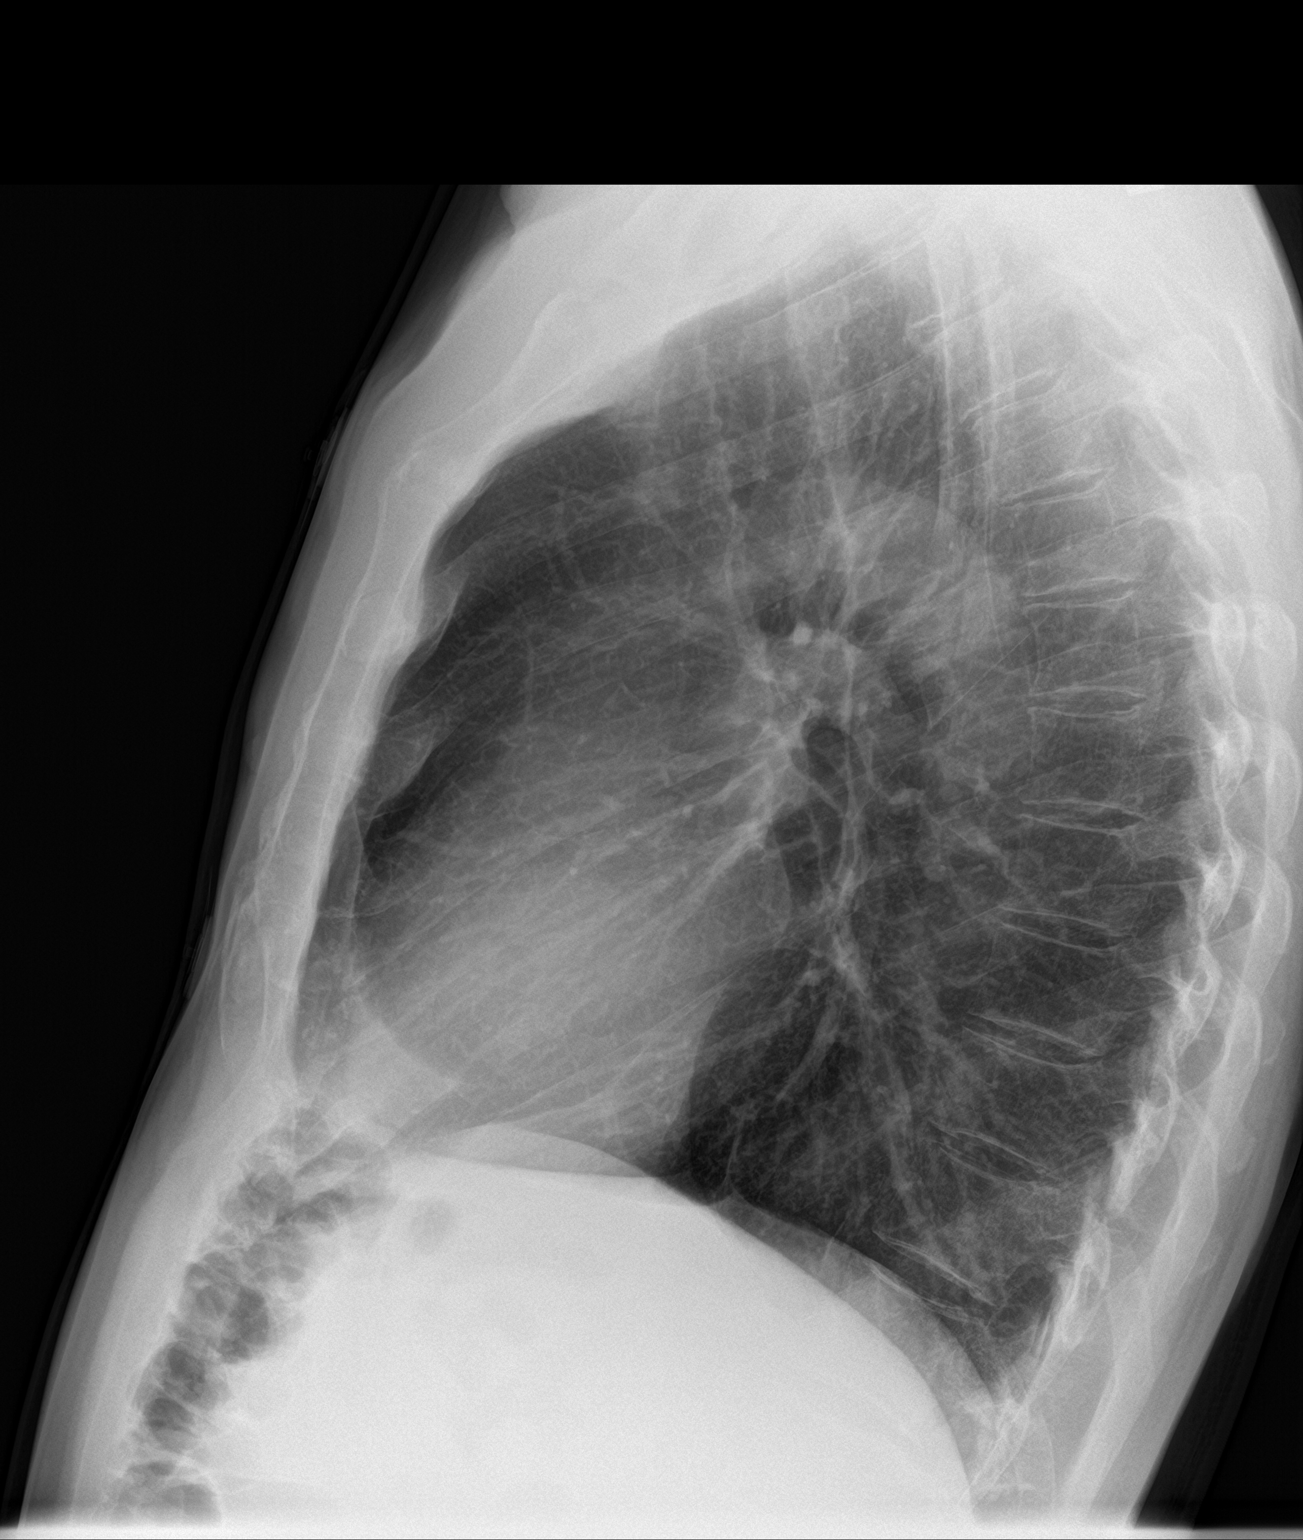

[2 of 2 positions shown; findings below may reference images not displayed]

FINDINGS: The heart size and mediastinal contours are within normal limits.
Both lungs are clear. No pneumothorax or pleural effusion is noted.
Old left rib fractures are noted.
IMPRESSION: No active cardiopulmonary disease.

## 2022-04-19 ENCOUNTER — Ambulatory Visit (INDEPENDENT_AMBULATORY_CARE_PROVIDER_SITE_OTHER): Payer: Medicare Other | Admitting: Internal Medicine

## 2022-04-19 ENCOUNTER — Encounter: Payer: Self-pay | Admitting: Internal Medicine

## 2022-04-19 ENCOUNTER — Other Ambulatory Visit (INDEPENDENT_AMBULATORY_CARE_PROVIDER_SITE_OTHER): Payer: Medicare Other

## 2022-04-19 VITALS — BP 108/60 | HR 92 | Ht 72.75 in | Wt 181.4 lb

## 2022-04-19 DIAGNOSIS — Z860100 Personal history of colon polyps, unspecified: Secondary | ICD-10-CM

## 2022-04-19 DIAGNOSIS — R1032 Left lower quadrant pain: Secondary | ICD-10-CM

## 2022-04-19 LAB — BASIC METABOLIC PANEL
BUN: 26 mg/dL — ABNORMAL HIGH (ref 6–23)
CO2: 27 mEq/L (ref 19–32)
Calcium: 9.3 mg/dL (ref 8.4–10.5)
Chloride: 103 mEq/L (ref 96–112)
Creatinine, Ser: 1.34 mg/dL (ref 0.40–1.50)
GFR: 54.44 mL/min — ABNORMAL LOW (ref >60.00)
Glucose, Bld: 108 mg/dL — ABNORMAL HIGH (ref 70–99)
Potassium: 3.9 mEq/L (ref 3.5–5.1)
Sodium: 138 mEq/L (ref 135–145)

## 2022-04-19 NOTE — Progress Notes (Signed)
HISTORY OF PRESENT ILLNESS:  Daniel Wu is a 66 y.o. male, retired, who presents today for evaluation of new persistent left-sided abdominal pain.  I last saw the patient November 21, 2018 regarding a history of multiple adenomatous colon polyps and the need for surveillance colonoscopy.  See dictation.  Colonoscopy was performed November 26, 2018.  He was found to have 3 diminutive adenomatous polyps.  Follow-up in 5 years recommended.  Continues on chronic Plavix.  Patient tells me that he was in his usual state of health until about 3 weeks ago when he noticed a pinching discomfort under the left rib cage.  He was comfortable for relatively short period of time.  However 1 week later he developed the same.  Thereafter he has had a constant pressure sensation in the left mid abdomen.  He feels like the discomfort improves after meals.  The discomfort is not affected by movement or bowel movements.  His weight has been stable.  There has been no bleeding.  No fevers.  Does have intermittent back problems.  Review of laboratories and x-ray shows no relevant data  REVIEW OF SYSTEMS:  All non-GI ROS negative unless otherwise stated in the HPI except for back pain, sinus and allergy trouble, sleeping problems  Past Medical History:  Diagnosis Date   Allergy    Anal fissure    Colon polyps    GERD (gastroesophageal reflux disease)    Headache    Hemorrhoids    High blood pressure    HLD (hyperlipidemia)    Seasonal allergies    Syncope    TIA (transient ischemic attack) 2016    Past Surgical History:  Procedure Laterality Date   COLONOSCOPY W/ POLYPECTOMY     RHINOPLASTY      Social History Khalel Alms  reports that he has never smoked. He has never used smokeless tobacco. He reports current alcohol use of about 2.0 standard drinks of alcohol per week. He reports that he does not use drugs.  family history includes Breast cancer in his mother; Diabetes in his mother; Hypertension in  his mother; Lung cancer in his brother; Prostate cancer in his father; Stomach cancer in his father.  Allergies  Allergen Reactions   Penicillins Cross Reactors Swelling    Did it involve swelling of the face/tongue/throat, SOB, or low BP? Yes Did it involve sudden or severe rash/hives, skin peeling, or any reaction on the inside of your mouth or nose? Yes Did you need to seek medical attention at a hospital or doctor's office? Yes When did it last happen?     age 9  If all above answers are "NO", may proceed with cephalosporin use.       PHYSICAL EXAMINATION: Vital signs: BP 108/60 (BP Location: Left Arm, Patient Position: Sitting, Cuff Size: Normal)   Pulse 92   Ht 6' 0.75" (1.848 m) Comment: height measured without shoes  Wt 181 lb 6 oz (82.3 kg)   BMI 24.09 kg/m   Constitutional: generally well-appearing, no acute distress Psychiatric: alert and oriented x3, cooperative Eyes: extraocular movements intact, anicteric, conjunctiva pink Mouth: oral pharynx moist, no lesions Neck: supple no lymphadenopathy Cardiovascular: heart regular rate and rhythm, no murmur Lungs: clear to auscultation bilaterally Abdomen: soft, nontender, nondistended, no obvious ascites, no peritoneal signs, normal bowel sounds, no organomegaly Rectal: Omitted Extremities: no clubbing, cyanosis, or lower extremity edema bilaterally Skin: no lesions on visible extremities Neuro: No focal deficits.  Cranial nerves intact  ASSESSMENT:  1.  2 to 3-week history of persistent left-sided abdominal discomfort/pressure.  Etiology unclear. 2.  History of multiple adenomatous colon polyps.  Multiple prior colonoscopies.  Surveillance up-to-date 3.  General medical problems including history of TIA on Plavix   PLAN:  1.  Schedule contrast-enhanced CT scan of the abdomen pelvis to evaluate persistent pain.  We will contact the patient after the results are available. 2.  Further recommendations after the above  completed.  Could consider empiric trial of PPI given the reported transient postprandial improvement. 3.  Surveillance colonoscopy around February 2025 A total time of 45 minutes was spent preparing to see the patient, reviewing data, obtaining history, performing comprehensive physical examination, counseling and educating the patient regarding the above listed issues, ordering advanced imaging study, and documenting clinical information in the health record

## 2022-04-19 NOTE — Patient Instructions (Signed)
If you are age 68 or older, your body mass index should be between 23-30. Your Body mass index is 24.09 kg/m. If this is out of the aforementioned range listed, please consider follow up with your Primary Care Provider.  If you are age 36 or younger, your body mass index should be between 19-25. Your Body mass index is 24.09 kg/m. If this is out of the aformentioned range listed, please consider follow up with your Primary Care Provider.   ________________________________________________________  The [facility] providers would like to encourage you to use [facility] to communicate with providers for non-urgent requests or questions.  Due to long hold times on the telephone, sending your provider a message by [facility] may be a faster and more efficient way to get a response.  Please allow 48 business hours for a response.  Please remember that this is for non-urgent requests.  _______________________________________________________  Your provider has requested that you go to the basement level for lab work before leaving today. Press "B" on the elevator. The lab is located at the first door on the left as you exit the elevator.  You have been scheduled for a CT scan of the abdomen and pelvis at [facility], 1st floor Radiology. You are scheduled on 05/02/2022  at 12:00pm. You should arrive 15 minutes prior to your appointment time for registration.  Please pick up 2 bottles of contrast from [facility] at least 3 days prior to your scan. The solution may taste better if refrigerated, but do NOT add ice or any other liquid to this solution. Shake well before drinking.   Please follow the written instructions below on the day of your exam:   1) Do not eat anything after 8:00am (4 hours prior to your test)   2) Drink 1 bottle of contrast @ 10:00am (2 hours prior to your exam)  Remember to shake well before drinking and do NOT pour over ice.     Drink 1 bottle of contrast @ 11:00am (1 hour prior to  your exam)   You may take any medications as prescribed with a small amount of water, if necessary. If you take any of the following medications: METFORMIN, GLUCOPHAGE, GLUCOVANCE, AVANDAMET, RIOMET, FORTAMET, [place] MET, JANUMET, GLUMETZA or METAGLIP, you MAY be asked to HOLD this medication 48 hours AFTER the exam.   The purpose of you drinking the oral contrast is to aid in the visualization of your intestinal tract. The contrast solution may cause some diarrhea. Depending on your individual set of symptoms, you may also receive an intravenous injection of x-ray contrast/dye. Plan on being at [facility] for 45 minutes or longer, depending on the type of exam you are having performed.   If you have any questions regarding your exam or if you need to reschedule, you may call [facility] at [phone] between the hours of 8:00 am and 5:00 pm, Monday-Friday.

## 2022-04-20 ENCOUNTER — Encounter: Payer: Self-pay | Admitting: Internal Medicine

## 2022-05-02 ENCOUNTER — Ambulatory Visit (HOSPITAL_COMMUNITY)
Admission: RE | Admit: 2022-05-02 | Discharge: 2022-05-02 | Disposition: A | Discharge status: Home / Self Care | Payer: Medicare Other | Source: Ambulatory Visit | Attending: Internal Medicine | Admitting: Internal Medicine

## 2022-05-02 DIAGNOSIS — R1032 Left lower quadrant pain: Secondary | ICD-10-CM | POA: Diagnosis not present

## 2022-05-02 MED ORDER — IOHEXOL 9 MG/ML PO SOLN: 1000.0000 mL | Freq: Once | ORAL | Status: DC

## 2022-05-02 MED ORDER — IOHEXOL 300 MG/ML  SOLN
100.0000 mL | Freq: Once | INTRAMUSCULAR | Status: AC | PRN
  Administered 2022-05-02: 100 mL via INTRAVENOUS

## 2022-06-12 ENCOUNTER — Encounter: Payer: Self-pay | Admitting: Internal Medicine

## 2022-06-12 ENCOUNTER — Ambulatory Visit (INDEPENDENT_AMBULATORY_CARE_PROVIDER_SITE_OTHER): Payer: Medicare Other | Admitting: Internal Medicine

## 2022-06-12 VITALS — BP 126/72 | HR 85 | Ht <= 58 in | Wt 180.0 lb

## 2022-06-12 DIAGNOSIS — R1032 Left lower quadrant pain: Secondary | ICD-10-CM

## 2022-06-12 DIAGNOSIS — Z86010 Personal history of colonic polyps: Secondary | ICD-10-CM

## 2022-06-12 NOTE — Progress Notes (Signed)
HISTORY OF PRESENT ILLNESS:  Daniel Wu is a 66 y.o. male with past medical history as listed below who was evaluated in this office April 19, 2022 regarding a 2 to 3-week history of persistent left-sided abdominal discomfort/pressure.  See that dictation for details.  He subsequently underwent CT scan of the abdomen pelvis.  No acute abnormalities or explanation of pain.  He was told to take PPI and follow-up at this time.  He did not take PPI.  However, he does follow-up at this time.  He is pleased to report that his pain has resolved.  No other issues or complaints.  He does have questions regarding his surveillance colonoscopy in 2025.  REVIEW OF SYSTEMS:  All non-GI ROS negative.  Past Medical History:  Diagnosis Date   Allergy    Anal fissure    Colon polyps    GERD (gastroesophageal reflux disease)    Headache    Hemorrhoids    High blood pressure    HLD (hyperlipidemia)    Seasonal allergies    Syncope    TIA (transient ischemic attack) 2016    Past Surgical History:  Procedure Laterality Date   COLONOSCOPY W/ POLYPECTOMY     RHINOPLASTY      Social History Kristoff Coonradt  reports that he has never smoked. He has never used smokeless tobacco. He reports current alcohol use of about 2.0 standard drinks of alcohol per week. He reports that he does not use drugs.  family history includes Breast cancer in his mother; Diabetes in his mother; Hypertension in his mother; Lung cancer in his brother; Prostate cancer in his father; Stomach cancer in his father.  Allergies  Allergen Reactions   Penicillins Cross Reactors Swelling    Did it involve swelling of the face/tongue/throat, SOB, or low BP? Yes Did it involve sudden or severe rash/hives, skin peeling, or any reaction on the inside of your mouth or nose? Yes Did you need to seek medical attention at a hospital or doctor's office? Yes When did it last happen?     age 74  If all above answers are "NO", may proceed with  cephalosporin use.       PHYSICAL EXAMINATION: Vital signs: BP 126/72   Pulse 85   Ht (!) 6" (0.152 m)   Wt 180 lb (81.6 kg)   BMI 3515.38 kg/m   Constitutional: generally well-appearing, no acute distress Psychiatric: alert and oriented x3, cooperative Eyes: extraocular movements intact, anicteric, conjunctiva pink Mouth: oral pharynx moist, no lesions Neck: supple no lymphadenopathy Cardiovascular: heart regular rate and rhythm, no murmur Lungs: clear to auscultation bilaterally Abdomen: soft, nontender, nondistended, no obvious ascites, no peritoneal signs, normal bowel sounds, no organomegaly Rectal: Omitted Extremities: no lower extremity edema bilaterally Skin: no lesions on visible extremities Neuro: No focal deficits.  Cranial nerves intact  ASSESSMENT:  1.  Left-sided abdominal discomfort and pressure.  Resolved 2.  History of multiple adenomatous colon polyps.  Surveillance up-to-date 3.  Multiple medical problems including history of TIA on Plavix   PLAN:  1.  Expectant management regarding pain 2.  Surveillance colonoscopy around February 2025.  I reviewed with him the process for recall. 3.  Interval follow-up as needed

## 2022-06-12 NOTE — Patient Instructions (Signed)
Please follow up as needed.  _______________________________________________________  If you are age 68 or older, your body mass index should be between 23-30. Your Body mass index is 3,515.38 kg/m. If this is out of the aforementioned range listed, please consider follow up with your Primary Care Provider.  If you are age 51 or younger, your body mass index should be between 19-25. Your Body mass index is 3,515.38 kg/m. If this is out of the aformentioned range listed, please consider follow up with your Primary Care Provider.   ________________________________________________________  The [facility] providers would like to encourage you to use [facility] to communicate with providers for non-urgent requests or questions.  Due to long hold times on the telephone, sending your provider a message by [facility] may be a faster and more efficient way to get a response.  Please allow 48 business hours for a response.  Please remember that this is for non-urgent requests.  _______________________________________________________. Due to recent changes in healthcare laws, you may see the results of your imaging and laboratory studies on MyChart before your provider has had a chance to review them.  We understand that in some cases there may be results that are confusing or concerning to you. Not all laboratory results come back in the same time frame and the provider may be waiting for multiple results in order to interpret others.  Please give [place] 48 hours in order for your provider to thoroughly review all the results before contacting the office for clarification of your results.

## 2023-03-07 ENCOUNTER — Other Ambulatory Visit (INDEPENDENT_AMBULATORY_CARE_PROVIDER_SITE_OTHER): Payer: Medicare Other

## 2023-03-07 ENCOUNTER — Ambulatory Visit (INDEPENDENT_AMBULATORY_CARE_PROVIDER_SITE_OTHER): Payer: Medicare Other | Admitting: Orthopedic Surgery

## 2023-03-07 DIAGNOSIS — M5441 Lumbago with sciatica, right side: Secondary | ICD-10-CM

## 2023-03-07 DIAGNOSIS — M79602 Pain in left arm: Secondary | ICD-10-CM | POA: Diagnosis not present

## 2023-03-07 MED ORDER — PREDNISONE 5 MG (21) PO TBPK: ORAL_TABLET | ORAL | 0 refills | Status: DC

## 2023-03-07 NOTE — Progress Notes (Signed)
Office Visit Note   Patient: Daniel Wu           Date of Birth: 1956-04-03           MRN: [id number] Visit Date: 03/07/2023 Requested by: Garlan Fillers, MD [address] [place],  [place] [address] PCP: Garlan Fillers, MD  Subjective: Chief Complaint  Patient presents with   Left Shoulder - Pain   Lower Back - Pain    HPI: Daniel Wu is a 67 y.o. male who presents to the office reporting low back pain and left shoulder pain.  Pain in the left shoulder is been going on for several years.  He is left-hand dominant.  The pain will occasionally wake him from sleep at night.  Does radiate to the biceps.  He is still able to play golf.  Describes a little bit of neck pain on the left-hand side but no scapular pain.  Patient also reports low back pain.  He reports radiation into the right buttock.  Does not really radiate down the leg.  Typically this occurs about once or twice a year and last 7 to 10 days.  He does take Tylenol for the problem and tries to exercise.  He does like to play golf.  When he squats he has a grabbing pain currently in his back.  He is on Plavix as well as blood pressure medication and cholesterol medication..                ROS: All systems reviewed are negative as they relate to the chief complaint within the history of present illness.  Patient denies fevers or chills.  Assessment & Plan: Visit Diagnoses:  1. Left arm pain   2. Right-sided low back pain with right-sided sciatica, unspecified chronicity     Plan: Impression is left shoulder pain with possible impending rupture of biceps tendon based on presence of likely chronic rotator cuff tear and pain radiating to the biceps in the past 2 weeks.  I do not think there is any real intervention to be done at this time for that.  I do think that his back is actually more symptomatic than his shoulder at this time.  Having some right-sided radiculopathy.  This has been going on for several years with  recurrent episodes of pain.  Medrol Dosepak 6-day course indicated along with MRI scan of the lumbar spine to evaluate the right-sided radiculopathy.  If he does have foraminal compression or disc herniation we could consider injections if he can come off his Plavix for about 5 days.  Follow-up after his scan.  Follow-Up Instructions: No follow-ups on file.   Orders:  Orders Placed This Encounter  Procedures   XR Lumbar Spine 2-3 Views   XR Shoulder Left   XR Cervical Spine 2 or 3 views   MR Lumbar Spine w/o contrast   Meds ordered this encounter  Medications   predniSONE (STERAPRED UNI-PAK 21 TAB) 5 MG (21) TBPK tablet    Sig: Take dosepak as directed    Dispense:  21 tablet    Refill:  0      Procedures: No procedures performed   Clinical Data: No additional findings.  Objective: Vital Signs: There were no vitals taken for this visit.  Physical Exam:  Constitutional: Patient appears well-developed HEENT:  Head: Normocephalic Eyes:EOM are normal Neck: Normal range of motion Cardiovascular: Normal rate Pulmonary/chest: Effort normal Neurologic: Patient is alert Skin: Skin is warm Psychiatric: Patient has normal  mood and affect  Ortho Exam: Ortho exam demonstrates fit appearing patient.  He does have difficulty getting from the sitting to standing position because of his back.  Has positive straight leg raise and contralateral straight leg raise.  5 out of 5 ankle dorsiflexion plantarflexion quad and hamstring strength.  Reflexes are 1+ out of 4 bilateral patella and Achilles.  Pedal pulses palpable.  No groin pain on the right or left and side with internal or external rotation of the leg.  No definite paresthesias L1 S1 bilaterally.  Left shoulder is examined.  He has got some coarseness and grinding consistent with rotator cuff pathology.  He does have forward flexion abduction both above 90 degrees.  No Popeye deformity but he does have positive O'Brien's test and  positive speeds testing as well as tenderness in the bicipital groove.  Subscap strength 5+ out of 5 external rotation strength 5- out of 5.  Neck range of motion is full.  Specialty Comments:  No specialty comments available.  Imaging: XR Lumbar Spine 2-3 Views  Result Date: 03/07/2023 AP lateral radiographs lumbar spine reviewed.  Normal lordosis is present.  Minimal degenerative disc disease and facet arthritis is present throughout the lumbar spine.  No spondylolisthesis or compression fractures.  XR Shoulder Left  Result Date: 03/07/2023 AP axillary outlet radiographs left shoulder reviewed.  No glenohumeral joint or AC joint arthritis.  Acromiohumeral distance 1 to 2 mm less than normal.  No acute fracture.  Visualized lung fields clear.  XR Cervical Spine 2 or 3 views  Result Date: 03/07/2023 AP lateral radiographs cervical spine reviewed.  Mild anterior osteophyte noted around C5-6.  Normal lordosis.  Fairly minimal degenerative disc disease and facet arthritis present in the cervical spine with no spondylolisthesis or compression fractures.    PMFS History: Patient Active Problem List   Diagnosis Date Noted   Mitral valve prolapse 11/06/2014   Chest pain 10/12/2014   Dizziness 10/12/2014   Orthostatic hypotension 09/23/2013   HEMORRHOIDS-INTERNAL 11/23/2008   RECTAL BLEEDING 11/23/2008   PERSONAL HX COLONIC POLYPS 11/23/2008   Past Medical History:  Diagnosis Date   Allergy    Anal fissure    Colon polyps    GERD (gastroesophageal reflux disease)    Headache    Hemorrhoids    High blood pressure    HLD (hyperlipidemia)    Seasonal allergies    Syncope    TIA (transient ischemic attack) 2016    Family History  Problem Relation Age of Onset   Breast cancer Mother    Hypertension Mother    Diabetes Mother    Stomach cancer Father    Prostate cancer Father    Lung cancer Brother    Colon polyps Neg Hx    Rectal cancer Neg Hx    Esophageal cancer Neg Hx      Past Surgical History:  Procedure Laterality Date   COLONOSCOPY W/ POLYPECTOMY     RHINOPLASTY     Social History   Occupational History   Occupation: retired  Tobacco Use   Smoking status: Never   Smokeless tobacco: Never  Vaping Use   Vaping Use: Never used  Substance and Sexual Activity   Alcohol use: Yes    Alcohol/week: 2.0 standard drinks of alcohol    Types: 2 Standard drinks or equivalent per week    Comment: Socially - beer 4-5 per month   Drug use: No   Sexual activity: Yes    Partners: Female

## 2023-03-08 ENCOUNTER — Encounter: Payer: Self-pay | Admitting: Orthopedic Surgery

## 2023-03-14 ENCOUNTER — Other Ambulatory Visit: Payer: Self-pay | Admitting: Orthopedic Surgery

## 2023-03-14 ENCOUNTER — Encounter: Payer: Self-pay | Admitting: Orthopedic Surgery

## 2023-03-14 NOTE — Telephone Encounter (Signed)
Don't want to do two steroid dosepacks so close together.  I'd recommend tylenol alternating with Ibuprofen if he can take nsaids. If this doesn't work, we can try something else

## 2023-03-15 ENCOUNTER — Ambulatory Visit (HOSPITAL_COMMUNITY)
Admission: RE | Admit: 2023-03-15 | Discharge: 2023-03-15 | Disposition: A | Discharge status: Home / Self Care | Payer: Medicare Other | Source: Ambulatory Visit | Attending: Orthopedic Surgery | Admitting: Orthopedic Surgery

## 2023-03-15 DIAGNOSIS — M5441 Lumbago with sciatica, right side: Secondary | ICD-10-CM | POA: Insufficient documentation

## 2023-03-21 ENCOUNTER — Encounter: Payer: Self-pay | Admitting: Orthopedic Surgery

## 2023-03-23 NOTE — Telephone Encounter (Signed)
I called he is better we will see him next week thx

## 2023-03-28 ENCOUNTER — Ambulatory Visit (INDEPENDENT_AMBULATORY_CARE_PROVIDER_SITE_OTHER): Payer: Medicare Other | Admitting: Orthopedic Surgery

## 2023-03-28 ENCOUNTER — Encounter: Payer: Self-pay | Admitting: Orthopedic Surgery

## 2023-03-28 DIAGNOSIS — M5441 Lumbago with sciatica, right side: Secondary | ICD-10-CM | POA: Diagnosis not present

## 2023-03-28 DIAGNOSIS — M79602 Pain in left arm: Secondary | ICD-10-CM | POA: Diagnosis not present

## 2023-03-28 NOTE — Progress Notes (Signed)
Office Visit Note   Patient: Daniel Wu           Date of Birth: [birth year]           MRN: [id number] Visit Date: 03/28/2023 Requested by: Garlan Fillers, MD [address] [place],  [place] [address] PCP: Garlan Fillers, MD  Subjective: Chief Complaint  Patient presents with   Lower Back - Follow-up    HPI: Daniel Wu is a 67 y.o. male who presents to the office reporting low back pain which has improved along with left shoulder pain.  He did get some relief while he was on the Dosepak but the symptoms recurred mildly with discontinuation of the Dosepak.  Overall he is about 90% better.  About 1 time a year he does report that grabbing type pain.  He feels like the back was tight from being "cramped up" for a month.  He is going to see Dr. Yetta Barre in 2 to 3 weeks and does not want to get an injection in the back prior to that visit in order to make the clinical presentation is clear as possible.  His left shoulder is doing reasonly well.  Does report continued pain radiating into the biceps.  Has some coarseness in the shoulder with range of motion consistent with rotator cuff pathology likely involving the entire supraspinatus tendon.  Biceps tendon rupture possible with continued activity and that was discussed last clinic visit as well..                ROS: All systems reviewed are negative as they relate to the chief complaint within the history of present illness.  Patient denies fevers or chills.  Assessment & Plan: Visit Diagnoses:  1. Left arm pain   2. Right-sided low back pain with right-sided sciatica, unspecified chronicity     Plan: Impression is patient is doing well following Medrol Dosepak for exacerbation of low back pain.  MRI scan is reviewed with the patient and it shows no compressive lesions or significant arthritis.  On my review he does have a little bit of mild to moderate facet arthritis but it is noncompressive on any of the nerve roots.  Plan at this  time is to see Dr. Yetta Barre and then after that if he has a flareup that does not improve with Medrol Dosepak we could consider therapeutic epidural steroid injection at that time.  Regarding left shoulder if his symptoms worsen we could consider injection and/or MRI scanning but from exam and history I think he definitely has minimal arthritis in the shoulder but definite rotator cuff pathology which has uncover the biceps tendon making its rupture in the future predictable.  He will follow-up as needed.  Follow-Up Instructions: No follow-ups on file.   Orders:  No orders of the defined types were placed in this encounter.  No orders of the defined types were placed in this encounter.     Procedures: No procedures performed   Clinical Data: No additional findings.  Objective: Vital Signs: There were no vitals taken for this visit.  Physical Exam:  Constitutional: Patient appears well-developed HEENT:  Head: Normocephalic Eyes:EOM are normal Neck: Normal range of motion Cardiovascular: Normal rate Pulmonary/chest: Effort normal Neurologic: Patient is alert Skin: Skin is warm Psychiatric: Patient has normal mood and affect  Ortho Exam: Ortho exam demonstrates excellent passive range of motion and active range of motion of the shoulder above 90 degrees of abduction and forward flexion.  Rotator cuff strength  is a little better today to external rotation at 5 out of 5 on the left 5+ out of 5 on the right.  Does have some coarseness with internal/external rotation of the left shoulder.  No nerve root tension signs in the legs and he has good forward flexion with no pain with extension.  Specialty Comments:  No specialty comments available.  Imaging: No results found.   PMFS History: Patient Active Problem List   Diagnosis Date Noted   Mitral valve prolapse 11/06/2014   Chest pain 10/12/2014   Dizziness 10/12/2014   Orthostatic hypotension 09/23/2013   HEMORRHOIDS-INTERNAL  11/23/2008   RECTAL BLEEDING 11/23/2008   PERSONAL HX COLONIC POLYPS 11/23/2008   Past Medical History:  Diagnosis Date   Allergy    Anal fissure    Colon polyps    GERD (gastroesophageal reflux disease)    Headache    Hemorrhoids    High blood pressure    HLD (hyperlipidemia)    Seasonal allergies    Syncope    TIA (transient ischemic attack) 2016    Family History  Problem Relation Age of Onset   Breast cancer Mother    Hypertension Mother    Diabetes Mother    Stomach cancer Father    Prostate cancer Father    Lung cancer Brother    Colon polyps Neg Hx    Rectal cancer Neg Hx    Esophageal cancer Neg Hx     Past Surgical History:  Procedure Laterality Date   COLONOSCOPY W/ POLYPECTOMY     RHINOPLASTY     Social History   Occupational History   Occupation: retired  Tobacco Use   Smoking status: Never   Smokeless tobacco: Never  Vaping Use   Vaping Use: Never used  Substance and Sexual Activity   Alcohol use: Yes    Alcohol/week: 2.0 standard drinks of alcohol    Types: 2 Standard drinks or equivalent per week    Comment: Socially - beer 4-5 per month   Drug use: No   Sexual activity: Yes    Partners: Female

## 2023-05-11 ENCOUNTER — Encounter: Payer: Self-pay | Admitting: Family Medicine

## 2023-05-15 ENCOUNTER — Encounter: Payer: Self-pay | Admitting: Family Medicine

## 2023-05-15 ENCOUNTER — Ambulatory Visit (INDEPENDENT_AMBULATORY_CARE_PROVIDER_SITE_OTHER): Payer: Medicare Other | Admitting: Family Medicine

## 2023-05-15 VITALS — BP 128/70 | Ht 74.0 in | Wt 180.0 lb

## 2023-05-15 DIAGNOSIS — G8929 Other chronic pain: Secondary | ICD-10-CM | POA: Diagnosis not present

## 2023-05-15 DIAGNOSIS — M25512 Pain in left shoulder: Secondary | ICD-10-CM

## 2023-05-15 MED ORDER — NITROGLYCERIN 0.2 MG/HR TD PT24: MEDICATED_PATCH | TRANSDERMAL | 1 refills | Status: DC

## 2023-05-15 NOTE — Progress Notes (Signed)
PCP: Garlan Fillers, MD  Subjective:   HPI: Patient is a 67 y.o. male here for left shoulder pain.  Patient reports he's had a couple years of left shoulder pain though past 3 months have been worse. Feels like a cramp anterior shoulder. Worse with overhead motions. Cannot lift items with elbow flexed due to pain anterior shoulder. Had radiographs that were normal at [facility].  Past Medical History:  Diagnosis Date   Allergy    Anal fissure    Colon polyps    GERD (gastroesophageal reflux disease)    Headache    Hemorrhoids    High blood pressure    HLD (hyperlipidemia)    Seasonal allergies    Syncope    TIA (transient ischemic attack) 2016    Current Outpatient Medications on File Prior to Visit  Medication Sig Dispense Refill   acetaminophen (TYLENOL) 500 MG tablet Take 1,000 mg by mouth as needed for mild pain.     cetirizine (ZYRTEC) 10 MG tablet Take 10 mg by mouth as needed for allergies.     clopidogrel (PLAVIX) 75 MG tablet Take 75 mg by mouth daily.      olmesartan (BENICAR) 20 MG tablet Take 20 mg by mouth daily.     pravastatin (PRAVACHOL) 20 MG tablet Take 20 mg by mouth daily.     predniSONE (STERAPRED UNI-PAK 21 TAB) 5 MG (21) TBPK tablet Take dosepak as directed 21 tablet 0   No current facility-administered medications on file prior to visit.    Past Surgical History:  Procedure Laterality Date   COLONOSCOPY W/ POLYPECTOMY     RHINOPLASTY      Allergies  Allergen Reactions   Penicillins Cross Reactors Swelling    Did it involve swelling of the face/tongue/throat, SOB, or low BP? Yes Did it involve sudden or severe rash/hives, skin peeling, or any reaction on the inside of your mouth or nose? Yes Did you need to seek medical attention at a hospital or doctor's office? Yes When did it last happen?     age 61  If all above answers are "NO", may proceed with cephalosporin use.    BP 128/70   Ht 6\' 2"  (1.88 m)   Wt 180 lb (81.6 kg)   BMI  23.11 kg/m       No data to display              No data to display              Objective:  Physical Exam:  Gen: NAD, comfortable in exam room  Left shoulder: No swelling, ecchymoses.  No gross deformity. No TTP over biceps tendon, AC joint. FROM with painful arc. Negative Hawkins, Neers. Positive Yergasons and speeds. Strength 5/5 with empty can and resisted internal/external rotation.  Mild pain empty can. NV intact distally.  Limited MSK u/s left shoulder: Biceps tendon thickened proximally with moderate tenosynovitis, heterogeneous signal in tendon.  Supraspinatus with chronic full thickness tear with high-riding humerus.   Assessment & Plan:  1. Left shoulder pain - chronic though worse past 3 months.  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  Start nitroglycerin patch - discussed risks of headache, skin irritation.  Home exercises reviewed today.  Consider physical therapy if not improving.  Advised against steroid injection.  F/u in 6 weeks.

## 2023-05-15 NOTE — Patient Instructions (Signed)
You have biceps tendinopathy and a chronic rotator cuff tear (supraspinatus). Start nitroglycerin patch - 1/4th patch to affected area, change daily. Do home exercises as directed once a day. Consider physical therapy if not improving. Follow up with me in 6 weeks for reevaluation.

## 2023-05-17 ENCOUNTER — Encounter: Payer: Self-pay | Admitting: Family Medicine

## 2023-06-25 ENCOUNTER — Encounter: Payer: Self-pay | Admitting: Family Medicine

## 2023-06-25 ENCOUNTER — Ambulatory Visit (INDEPENDENT_AMBULATORY_CARE_PROVIDER_SITE_OTHER): Payer: Medicare Other | Admitting: Family Medicine

## 2023-06-25 VITALS — BP 126/78 | Ht 74.0 in | Wt 180.0 lb

## 2023-06-25 DIAGNOSIS — G8929 Other chronic pain: Secondary | ICD-10-CM | POA: Diagnosis not present

## 2023-06-25 DIAGNOSIS — M25512 Pain in left shoulder: Secondary | ICD-10-CM

## 2023-06-25 NOTE — Progress Notes (Signed)
PCP: Garlan Fillers, MD  Subjective:   HPI: Patient is a 67 y.o. male here for left shoulder pain.  8/13: Patient reports he's had a couple years of left shoulder pain though past 3 months have been worse. Feels like a cramp anterior shoulder. Worse with overhead motions. Cannot lift items with elbow flexed due to pain anterior shoulder. Had radiographs that were normal at [facility].  9/23: Patient reports overall he feels like his shoulder has gotten worse since last visit. Pain is anterior and feels like he cannot lift very much. Pain is achy all the time now. He is using nitro patches, one fourth of a patch without any side effects. He is doing home exercises well. No new injuries.  Past Medical History:  Diagnosis Date   Allergy    Anal fissure    Colon polyps    GERD (gastroesophageal reflux disease)    Headache    Hemorrhoids    High blood pressure    HLD (hyperlipidemia)    Seasonal allergies    Syncope    TIA (transient ischemic attack) 2016    Current Outpatient Medications on File Prior to Visit  Medication Sig Dispense Refill   acetaminophen (TYLENOL) 500 MG tablet Take 1,000 mg by mouth as needed for mild pain.     cetirizine (ZYRTEC) 10 MG tablet Take 10 mg by mouth as needed for allergies.     clopidogrel (PLAVIX) 75 MG tablet Take 75 mg by mouth daily.      nitroGLYCERIN (NITRODUR - DOSED IN MG/24 HR) 0.2 mg/hr patch Apply 1/4th patch to affected shoulder, change daily 30 patch 1   olmesartan (BENICAR) 20 MG tablet Take 20 mg by mouth daily.     pravastatin (PRAVACHOL) 20 MG tablet Take 20 mg by mouth daily.     predniSONE (STERAPRED UNI-PAK 21 TAB) 5 MG (21) TBPK tablet Take dosepak as directed 21 tablet 0   No current facility-administered medications on file prior to visit.    Past Surgical History:  Procedure Laterality Date   COLONOSCOPY W/ POLYPECTOMY     RHINOPLASTY      Allergies  Allergen Reactions   Penicillins Cross Reactors  Swelling    Did it involve swelling of the face/tongue/throat, SOB, or low BP? Yes Did it involve sudden or severe rash/hives, skin peeling, or any reaction on the inside of your mouth or nose? Yes Did you need to seek medical attention at a hospital or doctor's office? Yes When did it last happen?     age 84  If all above answers are "NO", may proceed with cephalosporin use.    BP 126/78   Ht 6\' 2"  (1.88 m)   Wt 180 lb (81.6 kg)   BMI 23.11 kg/m       No data to display              No data to display              Objective:  Physical Exam:  Gen: NAD, comfortable in exam room  Left shoulder: No swelling, ecchymoses.  No gross deformity. TTP biceps tendon. FROM. Negative Hawkins, Neers. Positive Yergasons. Strength 5/5 with empty can and resisted internal/external rotation.  NV intact distally.   Assessment & Plan:  1. Left shoulder pain -most of his pain at this point related to proximal biceps tenosynovitis.  He does have evidence on ultrasound last visit of a full-thickness chronic supraspinatus tear.  He does seem to have  compensated well for this though with pretty good strength.  We discussed options.  He will increase the nitro patches to one half patch and change daily as well as start physical therapy for this.  If not improving would consider a biceps tendon sheath injection.  Follow-up in 5 to 6 weeks.  Can take Tylenol as needed.

## 2023-06-25 NOTE — Patient Instructions (Signed)
Increase the patches to 1/2 patch and change daily. Start physical therapy at [facility] for your proximal biceps tendinopathy and chronic supraspinatus tear. Tylenol 3 times a day as needed. Consider injection if you're still not making progress. Follow up with me in 5-6 weeks.

## 2023-07-02 ENCOUNTER — Other Ambulatory Visit: Payer: Self-pay

## 2023-07-02 MED ORDER — NITROGLYCERIN 0.2 MG/HR TD PT24: MEDICATED_PATCH | TRANSDERMAL | 1 refills | Status: AC

## 2023-08-01 ENCOUNTER — Ambulatory Visit: Payer: Medicare Other | Admitting: Family Medicine

## 2023-11-15 ENCOUNTER — Ambulatory Visit: Payer: Medicare Other | Admitting: Internal Medicine

## 2023-11-15 ENCOUNTER — Encounter: Payer: Self-pay | Admitting: Internal Medicine

## 2023-11-15 VITALS — BP 112/60 | HR 73 | Ht 73.0 in | Wt 181.6 lb

## 2023-11-15 DIAGNOSIS — Z8673 Personal history of transient ischemic attack (TIA), and cerebral infarction without residual deficits: Secondary | ICD-10-CM | POA: Diagnosis not present

## 2023-11-15 DIAGNOSIS — Z860101 Personal history of adenomatous and serrated colon polyps: Secondary | ICD-10-CM

## 2023-11-15 DIAGNOSIS — Z7902 Long term (current) use of antithrombotics/antiplatelets: Secondary | ICD-10-CM | POA: Diagnosis not present

## 2023-11-15 DIAGNOSIS — Z860100 Personal history of colon polyps, unspecified: Secondary | ICD-10-CM

## 2023-11-15 MED ORDER — SUFLAVE 178.7 G PO SOLR: 1.0000 | Freq: Once | ORAL | 0 refills | Status: AC

## 2023-11-15 NOTE — Patient Instructions (Signed)
You have been scheduled for a colonoscopy. Please follow written instructions given to you at your visit today.   If you use inhalers (even only as needed), please bring them with you on the day of your procedure.  DO NOT TAKE 7 DAYS PRIOR TO TEST- Trulicity (dulaglutide) Ozempic, Wegovy (semaglutide) Mounjaro (tirzepatide) Bydureon Bcise (exanatide extended release)  DO NOT TAKE 1 DAY PRIOR TO YOUR TEST Rybelsus (semaglutide) Adlyxin (lixisenatide) Victoza (liraglutide) Byetta (exanatide) ___________________________________________________________________________  Bonita Quin will receive your bowel preparation through [facility], which ensures the lowest copay and home delivery, with outreach via text or call from an [phone] number. Please respond promptly to avoid rescheduling of your procedure. If you are interested in alternative options or have any questions regarding your prep, please contact them at [phone] ____________________________________________________________________________  Your Provider Has Sent Your Bowel Prep Regimen To [facility]   [facility] will contact you to verify your information and collect your copay, if applicable. Enjoy the comfort of your home while your prescription is mailed to you, FREE of any shipping charges.   [facility] accepts all major insurance benefits and applies discounts & coupons.  Have additional questions?   Chat: [email or web address] Call: [phone] Email: [email or web address] NCPDP: [id number]  How will [facility] contact you?  With a Welcome phone call,  a Welcome text and a checkout link in text form.  Texts you receive from [phone] Are NOT Spam.  *To set up delivery, you must complete the checkout process via link or speak to one of the patient care representatives. If [facility] is unable to reach you, your prescription may be delayed.  To avoid long hold times on the phone, you may also utilize the secure chat  feature on the [facility] website to request that they call you back for transaction completion or to expedite your concerns.

## 2023-11-15 NOTE — Progress Notes (Signed)
HISTORY OF PRESENT ILLNESS:  Daniel Wu is a 68 y.o. male with past medical history as listed below, including history of TIA for which he is on Plavix.  He has been followed in this office for GERD and a history of adenomatous colon polyps.  He presents today regarding surveillance colonoscopy.  Patient was last seen in this office June 12, 2022 regarding left-sided abdominal discomfort which had resolved.  See that dictation for details.  Since that time, he tells me that he has been doing well.  Recently aggravated his lower back.  Otherwise, doing well.  No particular GI complaints.  Multiple prior colonoscopies.  Most recently 2020.  History of multiple and advanced adenomatous colon polyps.  Due for surveillance.  REVIEW OF SYSTEMS:  All non-GI ROS negative except for back pain, as mentioned  Past Medical History:  Diagnosis Date   Allergy    Anal fissure    Colon polyps    GERD (gastroesophageal reflux disease)    Headache    Hemorrhoids    High blood pressure    HLD (hyperlipidemia)    Seasonal allergies    Syncope    TIA (transient ischemic attack) 2016    Past Surgical History:  Procedure Laterality Date   COLONOSCOPY W/ POLYPECTOMY     RHINOPLASTY      Social History Daniel Wu  reports that he has never smoked. He has never used smokeless tobacco. He reports current alcohol use of about 2.0 standard drinks of alcohol per week. He reports that he does not use drugs.  family history includes Breast cancer in his mother; Diabetes in his mother; Hypertension in his mother; Lung cancer in his brother; Prostate cancer in his father; Stomach cancer in his father.  Allergies  Allergen Reactions   Penicillins Cross Reactors Swelling    Did it involve swelling of the face/tongue/throat, SOB, or low BP? Yes Did it involve sudden or severe rash/hives, skin peeling, or any reaction on the inside of your mouth or nose? Yes Did you need to seek medical attention at  a hospital or doctor's office? Yes When did it last happen?     age 58  If all above answers are "NO", may proceed with cephalosporin use.       PHYSICAL EXAMINATION: Vital signs: BP 112/60   Pulse 73   Ht 6\' 1"  (1.854 m)   Wt 181 lb 9.6 oz (82.4 kg)   SpO2 97%   BMI 23.96 kg/m   Constitutional: generally well-appearing, no acute distress Psychiatric: alert and oriented x3, cooperative Eyes: extraocular movements intact, anicteric, conjunctiva pink Mouth: oral pharynx moist, no lesions Neck: supple no lymphadenopathy Cardiovascular: heart regular rate and rhythm, no murmur Lungs: clear to auscultation bilaterally Abdomen: soft, nontender, nondistended, no obvious ascites, no peritoneal signs, normal bowel sounds, no organomegaly Rectal: Deferred until colonoscopy Extremities: no clubbing, cyanosis, or lower extremity edema bilaterally Skin: no lesions on visible extremities Neuro: No focal deficits.  Cranial nerves intact  ASSESSMENT:  1.  History of multiple and advanced adenomatous colon polyps.  Due for surveillance colonoscopy 2.  Multiple general medical problems including a history of TIA on Plavix   PLAN:  1.  Surveillance colonoscopy.  At the time of his last examination we decided to perform the procedure on Plavix given his history of TIA.  We will do the same at this time.  Pros and cons of interrupting/not interrupting Plavix reviewed.  He understands.  He is stable but higher than  average given these.  The nature of the procedure, as well as the risks, benefits, and alternatives were carefully and thoroughly reviewed with the patient. Ample time for discussion and questions allowed. The patient understood, was satisfied, and agreed to proceed.Issues

## 2023-12-19 ENCOUNTER — Ambulatory Visit: Payer: Medicare Other | Admitting: Internal Medicine

## 2023-12-19 ENCOUNTER — Encounter: Payer: Self-pay | Admitting: Internal Medicine

## 2023-12-19 VITALS — BP 113/66 | HR 62 | Temp 97.2°F | Resp 13 | Ht 73.0 in | Wt 181.0 lb

## 2023-12-19 DIAGNOSIS — K648 Other hemorrhoids: Secondary | ICD-10-CM | POA: Diagnosis not present

## 2023-12-19 DIAGNOSIS — Z1211 Encounter for screening for malignant neoplasm of colon: Secondary | ICD-10-CM

## 2023-12-19 DIAGNOSIS — D122 Benign neoplasm of ascending colon: Secondary | ICD-10-CM | POA: Diagnosis not present

## 2023-12-19 DIAGNOSIS — Z860100 Personal history of colon polyps, unspecified: Secondary | ICD-10-CM

## 2023-12-19 DIAGNOSIS — Z860101 Personal history of adenomatous and serrated colon polyps: Secondary | ICD-10-CM

## 2023-12-19 MED ORDER — SODIUM CHLORIDE 0.9 % IV SOLN: 500.0000 mL | Freq: Once | INTRAVENOUS | Status: DC

## 2023-12-19 NOTE — Patient Instructions (Addendum)
Resume previous diet Continue present medications Await pathology results Handouts/information given for polyps and hemorrhoids  YOU HAD AN ENDOSCOPIC PROCEDURE TODAY AT THE [facility]:   Refer to the procedure report that was given to you for any specific questions about what was found during the examination.  If the procedure report does not answer your questions, please call your gastroenterologist to clarify.  If you requested that your care partner not be given the details of your procedure findings, then the procedure report has been included in a sealed envelope for you to review at your convenience later.  YOU SHOULD EXPECT: Some feelings of bloating in the abdomen. Passage of more gas than usual.  Walking can help get rid of the air that was put into your GI tract during the procedure and reduce the bloating. If you had a lower endoscopy (such as a colonoscopy or flexible sigmoidoscopy) you may notice spotting of blood in your stool or on the toilet paper. If you underwent a bowel prep for your procedure, you may not have a normal bowel movement for a few days.  Please Note:  You might notice some irritation and congestion in your nose or some drainage.  This is from the oxygen used during your procedure.  There is no need for concern and it should clear up in a day or so.  SYMPTOMS TO REPORT IMMEDIATELY:  Following lower endoscopy (colonoscopy or flexible sigmoidoscopy):  Excessive amounts of blood in the stool  Significant tenderness or worsening of abdominal pains  Swelling of the abdomen that is new, acute  Fever of 100F or higher  For urgent or emergent issues, a gastroenterologist can be reached at any hour by calling [phone]. Do not use MyChart messaging for urgent concerns.    DIET:  We do recommend a small meal at first, but then you may proceed to your regular diet.  Drink plenty of fluids but you should avoid alcoholic beverages for 24  hours.  ACTIVITY:  You should plan to take it easy for the rest of today and you should NOT DRIVE or use heavy machinery until tomorrow (because of the sedation medicines used during the test).    FOLLOW UP: Our staff will call the number listed on your records the next business day following your procedure.  We will call around 7:15- 8:00 am to check on you and address any questions or concerns that you may have regarding the information given to you following your procedure. If we do not reach you, we will leave a message.     If any biopsies were taken you will be contacted by phone or by letter within the next 1-3 weeks.  Please call us at [phone] if you have not heard about the biopsies in 3 weeks.    SIGNATURES/CONFIDENTIALITY: You and/or your care partner have signed paperwork which will be entered into your electronic medical record.  These signatures attest to the fact that that the information above on your After Visit Summary has been reviewed and is understood.  Full responsibility of the confidentiality of this discharge information lies with you and/or your care-partner.

## 2023-12-19 NOTE — Progress Notes (Signed)
 Called to room to assist during endoscopic procedure.  Patient ID and intended procedure confirmed with present staff. Received instructions for my participation in the procedure from the performing physician.

## 2023-12-19 NOTE — Progress Notes (Signed)
 Expand All Collapse All HISTORY OF PRESENT ILLNESS:   Daniel Wu is a 68 y.o. male with past medical history as listed below, including history of TIA for which he is on Plavix.  He has been followed in this office for GERD and a history of adenomatous colon polyps.  He presents today regarding surveillance colonoscopy.   Patient was last seen in this office June 12, 2022 regarding left-sided abdominal discomfort which had resolved.  See that dictation for details.  Since that time, he tells me that he has been doing well.  Recently aggravated his lower back.  Otherwise, doing well.  No particular GI complaints.   Multiple prior colonoscopies.  Most recently 2020.  History of multiple and advanced adenomatous colon polyps.  Due for surveillance.   REVIEW OF SYSTEMS:   All non-GI ROS negative except for back pain, as mentioned       Past Medical History:  Diagnosis Date   Allergy     Anal fissure     Colon polyps     GERD (gastroesophageal reflux disease)     Headache     Hemorrhoids     High blood pressure     HLD (hyperlipidemia)     Seasonal allergies     Syncope     TIA (transient ischemic attack) 2016               Past Surgical History:  Procedure Laterality Date   COLONOSCOPY W/ POLYPECTOMY       RHINOPLASTY              Social History Daniel Wu  reports that he has never smoked. He has never used smokeless tobacco. He reports current alcohol use of about 2.0 standard drinks of alcohol per week. He reports that he does not use drugs.   family history includes Breast cancer in his mother; Diabetes in his mother; Hypertension in his mother; Lung cancer in his brother; Prostate cancer in his father; Stomach cancer in his father.   Allergies       Allergies  Allergen Reactions   Penicillins Cross Reactors Swelling      Did it involve swelling of the face/tongue/throat, SOB, or low BP? Yes Did it involve sudden or severe rash/hives, skin peeling, or any  reaction on the inside of your mouth or nose? Yes Did you need to seek medical attention at a hospital or doctor's office? Yes When did it last happen?     age 62  If all above answers are "NO", may proceed with cephalosporin use.            PHYSICAL EXAMINATION: Vital signs: BP 112/60   Pulse 73   Ht 6\' 1"  (1.854 m)   Wt 181 lb 9.6 oz (82.4 kg)   SpO2 97%   BMI 23.96 kg/m   Constitutional: generally well-appearing, no acute distress Psychiatric: alert and oriented x3, cooperative Eyes: extraocular movements intact, anicteric, conjunctiva pink Mouth: oral pharynx moist, no lesions Neck: supple no lymphadenopathy Cardiovascular: heart regular rate and rhythm, no murmur Lungs: clear to auscultation bilaterally Abdomen: soft, nontender, nondistended, no obvious ascites, no peritoneal signs, normal bowel sounds, no organomegaly Rectal: Deferred until colonoscopy Extremities: no clubbing, cyanosis, or lower extremity edema bilaterally Skin: no lesions on visible extremities Neuro: No focal deficits.  Cranial nerves intact   ASSESSMENT:   1.  History of multiple and advanced adenomatous colon polyps.  Due for surveillance colonoscopy 2.  Multiple general medical problems including  a history of TIA on Plavix     PLAN:   1.  Surveillance colonoscopy.  At the time of his last examination we decided to perform the procedure on Plavix given his history of TIA.  We will do the same at this time.  Pros and cons of interrupting/not interrupting Plavix reviewed.  He understands.  He is stable but higher than average given these.  The nature of the procedure, as well as the risks, benefits, and alternatives were carefully and thoroughly reviewed with the patient. Ample time for discussion and questions allowed. The patient understood, was satisfied, and agreed to proceed.Issues

## 2023-12-19 NOTE — Progress Notes (Signed)
 Report to PACU, RN, vss, BBS= Clear.

## 2023-12-19 NOTE — Op Note (Signed)
 [facility] Patient Name: Daniel Wu Procedure Date: 12/19/2023 10:10 AM MRN: [id number] Endoscopist: Wilhemina Bonito. Marina Goodell , MD, [id number] Age: 68 Referring MD:  Date of Birth: 1956-06-07 Gender: Male Account #: [id number] Procedure:                Colonoscopy with cold snare polypectomy x 1 Indications:              High risk colon cancer surveillance: Personal                            history of adenoma (10 mm or greater in size), High                            risk colon cancer surveillance: Personal history of                            multiple (3 or more) adenomas. Previous                            examinations 1996, 1997, 2002, 2005, 2010, 2015,                            2020 Medicines:                Monitored Anesthesia Care Procedure:                Pre-Anesthesia Assessment:                           - Prior to the procedure, a History and Physical                            was performed, and patient medications and                            allergies were reviewed. The patient's tolerance of                            previous anesthesia was also reviewed. The risks                            and benefits of the procedure and the sedation                            options and risks were discussed with the patient.                            All questions were answered, and informed consent                            was obtained. Prior Anticoagulants: The patient has                            taken Plavix (clopidogrel), last dose was day of  procedure. ASA Grade Assessment: III - A patient                            with severe systemic disease. After reviewing the                            risks and benefits, the patient was deemed in                            satisfactory condition to undergo the procedure.                           After obtaining informed consent, the colonoscope                            was passed under  direct vision. Throughout the                            procedure, the patient's blood pressure, pulse, and                            oxygen saturations were monitored continuously. The                            Olympus Scope SN [id number] was introduced through the                            anus and advanced to the the cecum, identified by                            appendiceal orifice and ileocecal valve. The                            ileocecal valve, appendiceal orifice, and rectum                            were photographed. The quality of the bowel                            preparation was excellent. The colonoscopy was                            performed without difficulty. The patient tolerated                            the procedure well. The bowel preparation used was                            SUPREP via split dose instruction. Scope In: 10:26:37 AM Scope Out: 10:39:32 AM Scope Withdrawal Time: 0 hours 8 minutes 9 seconds  Total Procedure Duration: 0 hours 12 minutes 55 seconds  Findings:                 A 4 mm polyp was found in  the ascending colon. The                            polyp was sessile. The polyp was removed with a                            cold snare. Resection and retrieval were complete.                           Internal hemorrhoids were found during                            retroflexion. The hemorrhoids were moderate and                            friable.                           The exam was otherwise without abnormality on                            direct and retroflexion views. Complications:            No immediate complications. Estimated blood loss:                            None. Estimated Blood Loss:     Estimated blood loss: none. Impression:               - One 4 mm polyp in the ascending colon, removed                            with a cold snare. Resected and retrieved.                           - Internal hemorrhoids.                            - The examination was otherwise normal on direct                            and retroflexion views. Recommendation:           - Repeat colonoscopy in 5 years for surveillance.                           - Patient has a contact number available for                            emergencies. The signs and symptoms of potential                            delayed complications were discussed with the                            patient. Return to normal activities tomorrow.  Written discharge instructions were provided to the                            patient.                           - Resume previous diet.                           - Continue present medications including Plavix.                           - Await pathology results. Wilhemina Bonito. Marina Goodell, MD 12/19/2023 10:48:45 AM This report has been signed electronically.

## 2023-12-20 ENCOUNTER — Telehealth: Payer: Self-pay

## 2023-12-20 NOTE — Telephone Encounter (Signed)
  Follow up Call-     12/19/2023    9:10 AM  Call back number  Post procedure Call Back phone  # [phone]  Permission to leave phone message Yes     Patient questions:  Do you have a fever, pain , or abdominal swelling? No. Pain Score  0 *  Have you tolerated food without any problems? Yes.    Have you been able to return to your normal activities? Yes.    Do you have any questions about your discharge instructions: Diet   No. Medications  No. Follow up visit  No.  Do you have questions or concerns about your Care? No.  Actions: * If pain score is 4 or above: No action needed, pain <4.

## 2023-12-21 LAB — SURGICAL PATHOLOGY

## 2023-12-22 ENCOUNTER — Encounter: Payer: Self-pay | Admitting: Internal Medicine
